# Patient Record
Sex: Female | Born: 1970 | Race: Black or African American | Hispanic: No | Marital: Married | Smoking: Never smoker
Health system: Southern US, Community
[De-identification: ages and names within clinical notes are randomized; demographics above are authoritative.]

## PROBLEM LIST (undated history)

## (undated) DIAGNOSIS — G35 Multiple sclerosis: Secondary | ICD-10-CM

## (undated) DIAGNOSIS — G35D Multiple sclerosis, unspecified: Secondary | ICD-10-CM

## (undated) DIAGNOSIS — I10 Essential (primary) hypertension: Secondary | ICD-10-CM

---

## 2020-10-09 ENCOUNTER — Inpatient Hospital Stay: Payer: Federal, State, Local not specified - PPO

## 2020-10-09 ENCOUNTER — Encounter: Payer: Self-pay | Admitting: Emergency Medicine

## 2020-10-09 ENCOUNTER — Other Ambulatory Visit: Payer: Self-pay

## 2020-10-09 ENCOUNTER — Inpatient Hospital Stay
Admission: EM | Admit: 2020-10-09 | Discharge: 2020-10-12 | DRG: 059 | Disposition: A | Discharge status: Home / Self Care | Payer: Federal, State, Local not specified - PPO | Attending: Internal Medicine | Admitting: Internal Medicine

## 2020-10-09 ENCOUNTER — Emergency Department: Payer: Federal, State, Local not specified - PPO

## 2020-10-09 DIAGNOSIS — Z6823 Body mass index (BMI) 23.0-23.9, adult: Secondary | ICD-10-CM

## 2020-10-09 DIAGNOSIS — Z20822 Contact with and (suspected) exposure to covid-19: Secondary | ICD-10-CM | POA: Diagnosis present

## 2020-10-09 DIAGNOSIS — H547 Unspecified visual loss: Secondary | ICD-10-CM

## 2020-10-09 DIAGNOSIS — N390 Urinary tract infection, site not specified: Secondary | ICD-10-CM | POA: Diagnosis present

## 2020-10-09 DIAGNOSIS — H469 Unspecified optic neuritis: Secondary | ICD-10-CM | POA: Diagnosis present

## 2020-10-09 DIAGNOSIS — G35 Multiple sclerosis: Principal | ICD-10-CM | POA: Diagnosis present

## 2020-10-09 DIAGNOSIS — Z8249 Family history of ischemic heart disease and other diseases of the circulatory system: Secondary | ICD-10-CM

## 2020-10-09 DIAGNOSIS — R001 Bradycardia, unspecified: Secondary | ICD-10-CM | POA: Diagnosis present

## 2020-10-09 DIAGNOSIS — I10 Essential (primary) hypertension: Secondary | ICD-10-CM | POA: Diagnosis present

## 2020-10-09 DIAGNOSIS — E440 Moderate protein-calorie malnutrition: Secondary | ICD-10-CM | POA: Diagnosis present

## 2020-10-09 HISTORY — DX: Multiple sclerosis: G35

## 2020-10-09 HISTORY — DX: Multiple sclerosis, unspecified: G35.D

## 2020-10-09 HISTORY — DX: Essential (primary) hypertension: I10

## 2020-10-09 LAB — BASIC METABOLIC PANEL
Anion gap: 12 (ref 5–15)
BUN: 21 mg/dL — ABNORMAL HIGH (ref 6–20)
CO2: 21 mmol/L — ABNORMAL LOW (ref 22–32)
Calcium: 9.7 mg/dL (ref 8.9–10.3)
Chloride: 104 mmol/L (ref 98–111)
Creatinine, Ser: 1.14 mg/dL — ABNORMAL HIGH (ref 0.44–1.00)
GFR, Estimated: 58 mL/min — ABNORMAL LOW (ref >60)
Glucose, Bld: 95 mg/dL (ref 70–99)
Potassium: 3.9 mmol/L (ref 3.5–5.1)
Sodium: 137 mmol/L (ref 135–145)

## 2020-10-09 LAB — CBC
HCT: 39.2 % (ref 36.0–46.0)
Hemoglobin: 12.9 g/dL (ref 12.0–15.0)
MCH: 27.5 pg (ref 26.0–34.0)
MCHC: 32.9 g/dL (ref 30.0–36.0)
MCV: 83.6 fL (ref 80.0–100.0)
Platelets: 217 10*3/uL (ref 150–400)
RBC: 4.69 MIL/uL (ref 3.87–5.11)
RDW: 13.5 % (ref 11.5–15.5)
WBC: 5.1 10*3/uL (ref 4.0–10.5)
nRBC: 0.4 % — ABNORMAL HIGH (ref 0.0–0.2)

## 2020-10-09 LAB — RESP PANEL BY RT-PCR (FLU A&B, COVID) ARPGX2
Influenza A by PCR: NEGATIVE
Influenza B by PCR: NEGATIVE
SARS Coronavirus 2 by RT PCR: NEGATIVE

## 2020-10-09 IMAGING — MR MR ORBITS WO/W CM
4 of 6 series · 29 of 48 positions shown · IV contrast (6ml Gadavist)
Comparison: None.

CLINICAL DATA: Multiple sclerosis, left eye blurry vision

EXAM:
MRI HEAD AND ORBITS WITHOUT AND WITH CONTRAST
TECHNIQUE: Multiplanar, multiecho pulse sequences of the brain and surrounding
structures were obtained without and with intravenous contrast.
Multiplanar, multiecho pulse sequences of the orbits and surrounding
structures were obtained including fat saturation techniques, before
and after intravenous contrast administration.
CONTRAST:  6mL GADAVIST GADOBUTROL 1 MMOL/ML IV SOLN

[Series 19: T2 · axial · 3.0mm · 0.78mm/px · z∈[-105,-46]mm · 5 of 17 slices shown (1 of 2)]
[im 1/17]
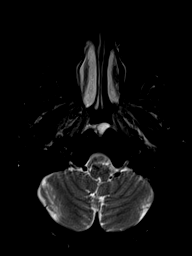
[im 5/17]
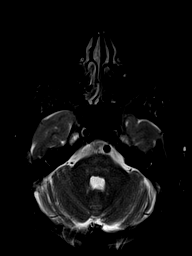
[im 9/17]
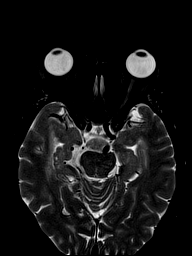
[im 13/17]
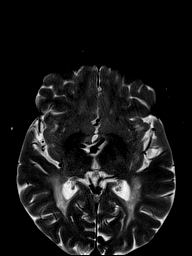
[im 17/17]
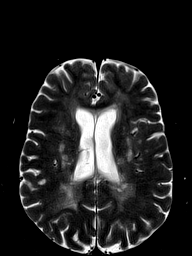

[Series 21: T2 · coronal · 3.0mm · 0.78mm/px · 9 of 35 slices shown (2 of 2)]
[im 1/35]
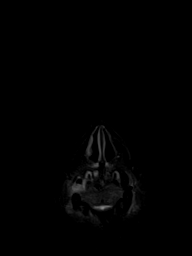
[im 5/35]
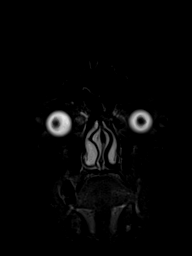
[im 9/35]
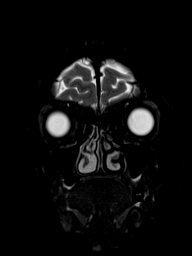
[im 13/35]
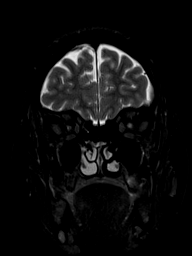
[im 18/35]
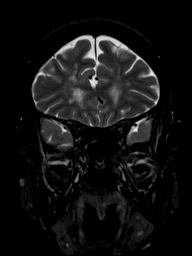
[im 22/35]
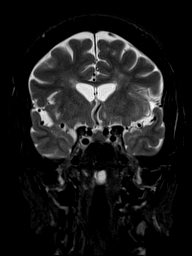
[im 26/35]
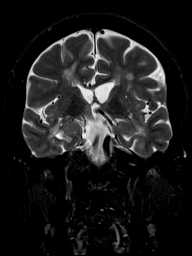
[im 30/35]
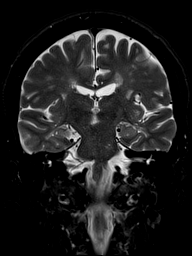
[im 35/35]
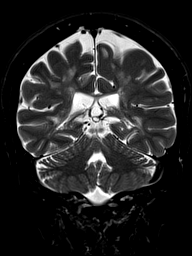

[Series 23: T1 · axial · non-contrast · 3.0mm · 0.31mm/px · z∈[-99,-38]mm · 6 of 23 slices shown (1 of 2)]
[im 1/23]
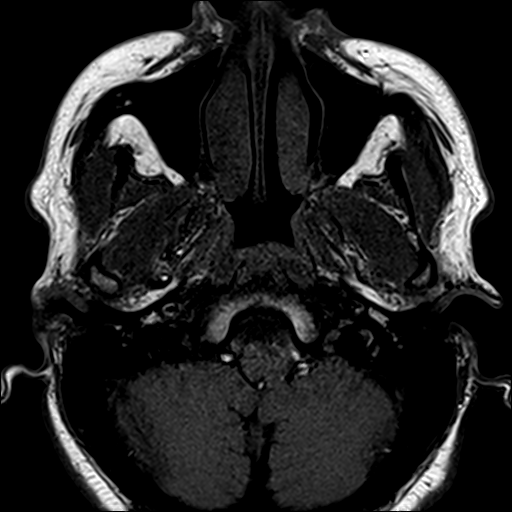
[im 5/23]
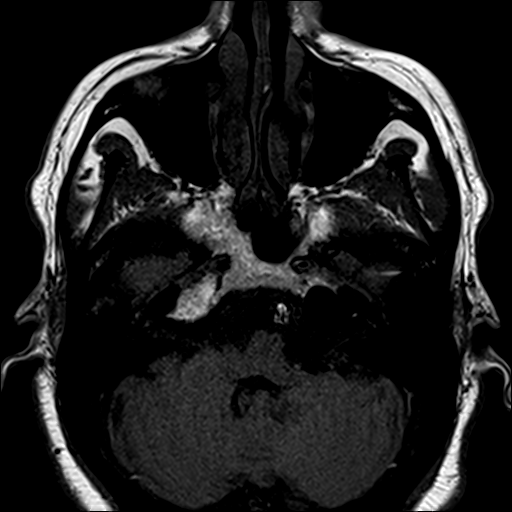
[im 9/23]
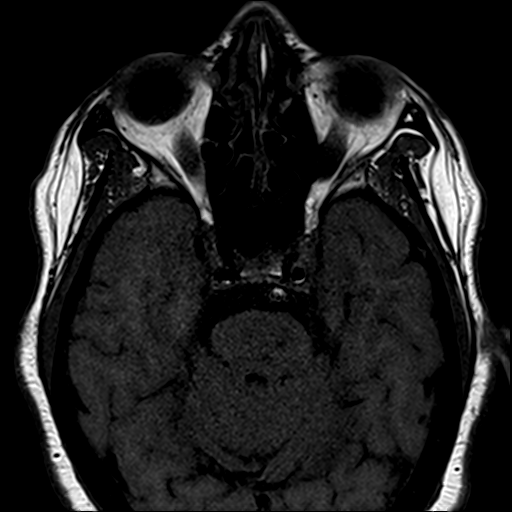
[im 14/23]
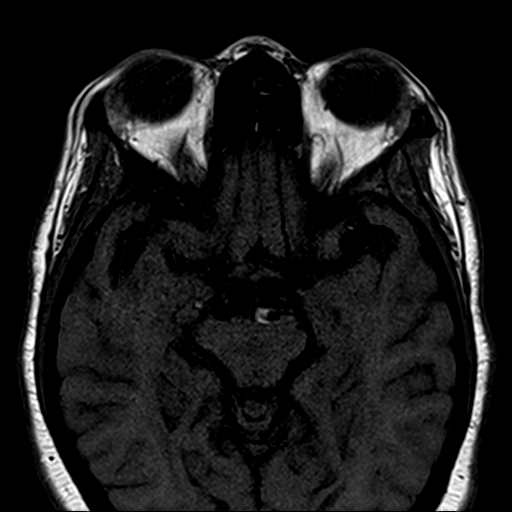
[im 18/23]
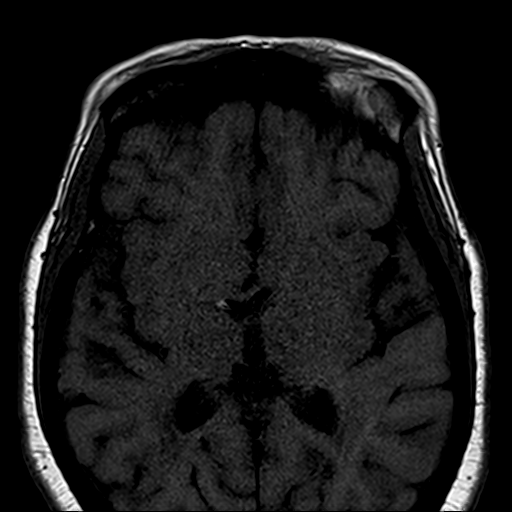
[im 23/23]
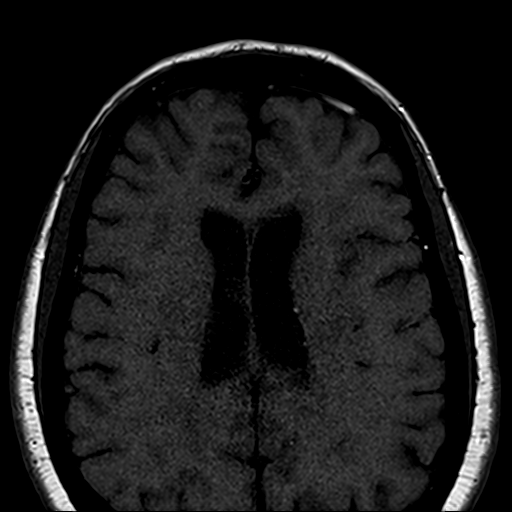

[Series 24: T1 · coronal · non-contrast · 3.0mm · 0.35mm/px · 9 of 40 slices shown (2 of 2)]
[im 1/40]
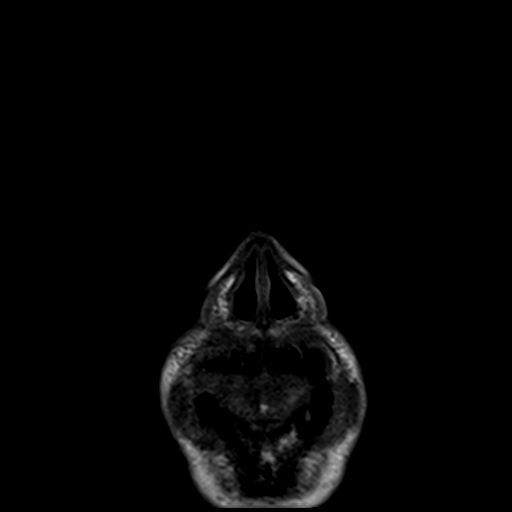
[im 4/40]
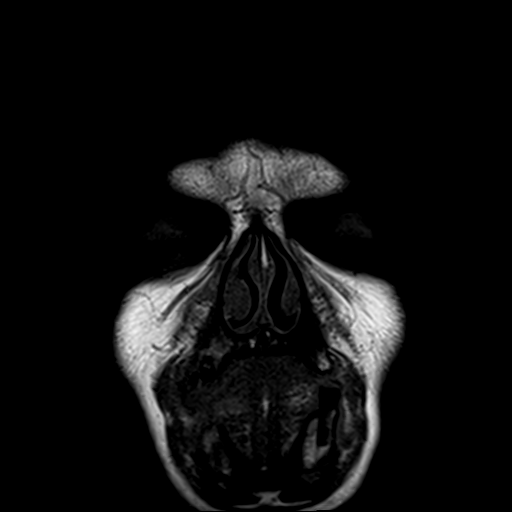
[im 8/40]
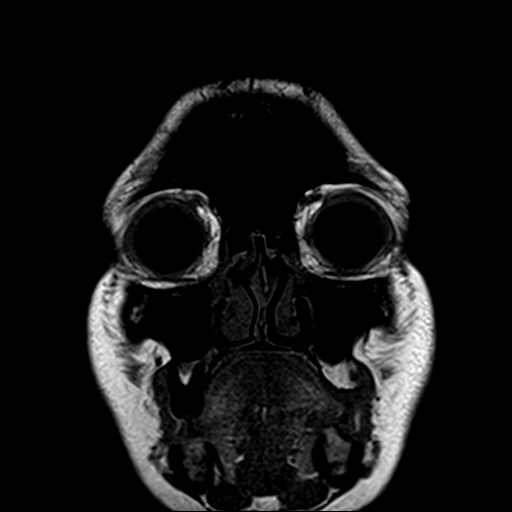
[im 12/40]
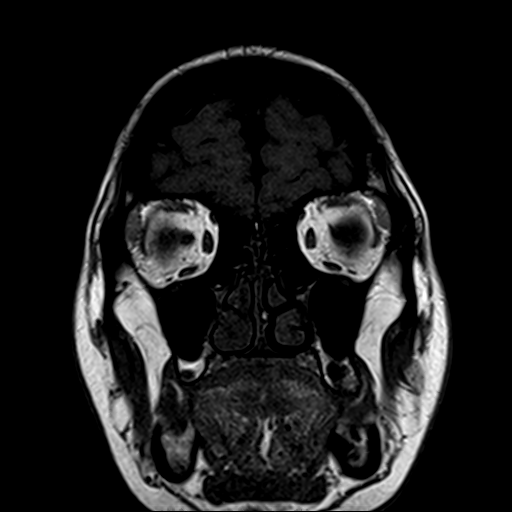
[im 16/40]
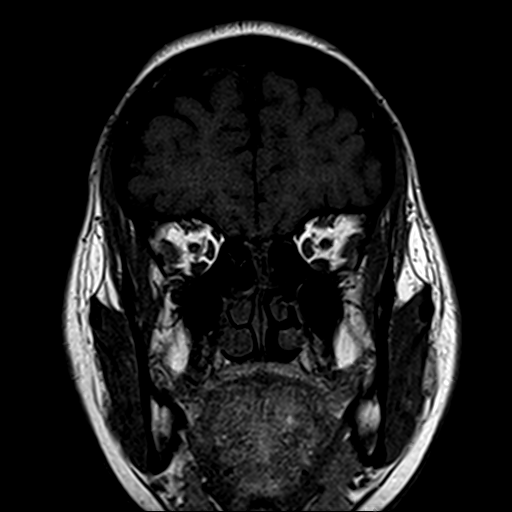
[im 20/40]
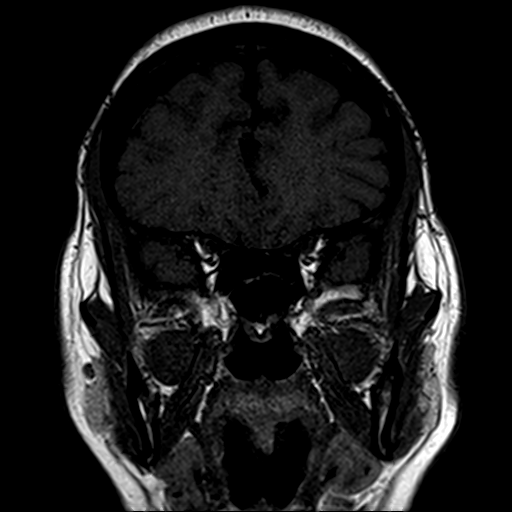
[im 24/40]
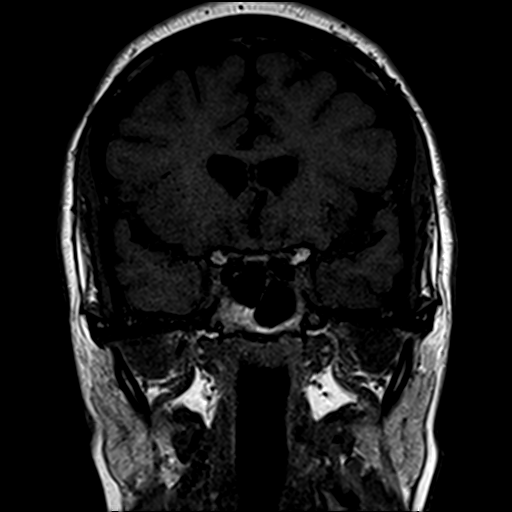
[im 28/40]
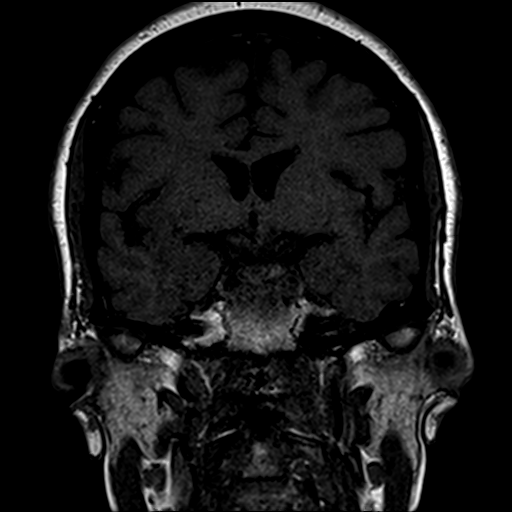
[im 36/40]
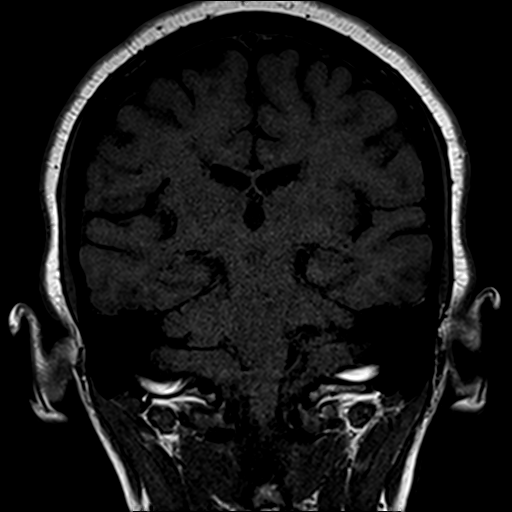

[29 of 48 positions shown; findings below may reference images not displayed]

FINDINGS: MRI HEAD FINDINGS

Brain: There is no acute infarction or intracranial hemorrhage.
Patchy and confluent areas of T2 hyperintensity in the
periventricular greater than subcortical white matter are compatible
with history of multiple sclerosis. There is some involvement of the
brainstem. Ex vacuo dilatation of the lateral ventricles. There is
thinning of the corpus callosum. No abnormal enhancement. No
definite evidence of PML. No prior imaging available for comparison.

Vascular: Major vessel flow voids at the skull base are preserved.

Skull and upper cervical spine: Marrow signal is within normal
limits.

Other: Sella is unremarkable.  Mastoid air cells are clear.

MRI ORBITS FINDINGS

Orbits: There is no proptosis. No intraorbital mass. Globes,
extraocular muscles, and lacrimal glands are symmetric and
unremarkable. There is no abnormal enhancement of the optic nerve
sheath complexes. Left optic nerve T2 hyperintensity is suspected,
but optic nerves also appear subjectively small.

Visualized sinuses: No significant opacification.

Soft tissues: Unremarkable.
IMPRESSION: No optic nerve enhancement. Left optic nerve T2 hyperintensity
suspected within limitation of motion artifact. The optic nerves do
appear subjectively small and therefore, this could reflect edema or
sequelae of prior inflammation (i.e. prior episodes of optic
neuritis).

Moderate white matter disease compatible with history of multiple
sclerosis. No evidence of active demyelination.

## 2020-10-09 IMAGING — CT CT HEAD W/O CM
3 series · 15 of 46 positions shown, 18 images · non-contrast
Comparison: None.

CLINICAL DATA: Neuro deficit. Stroke suspected. Abnormal vision in
the left eye for 2 weeks.

EXAM:
CT HEAD WITHOUT CONTRAST
TECHNIQUE: Contiguous axial images were obtained from the base of the skull
through the vertex without intravenous contrast.

[Series 2: head wo · axial · 0.41mm/px · z∈[+532,+652]mm · 9 of 29 slices shown, 12 images]
[im 3/29  brain]
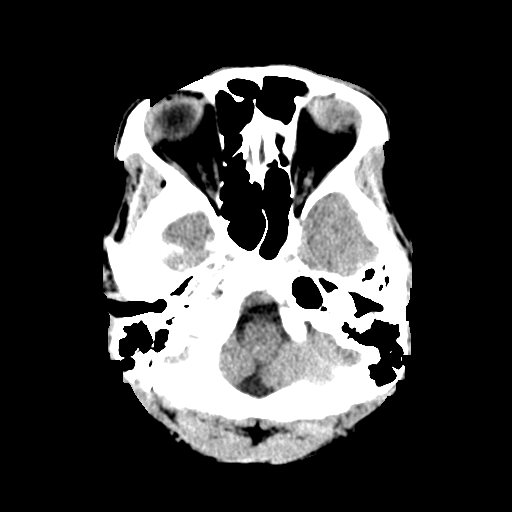
[im 3/29  bone]
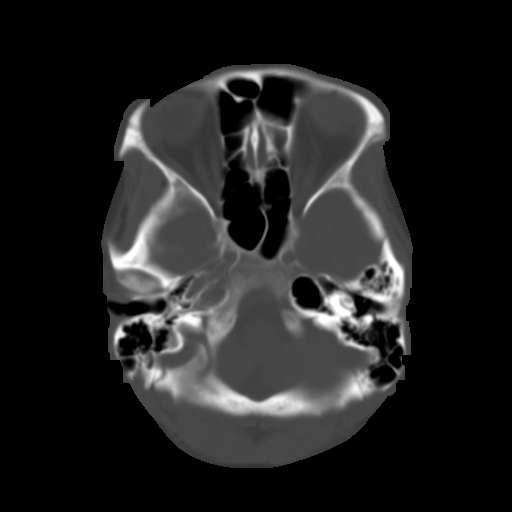
[im 6/29  brain]
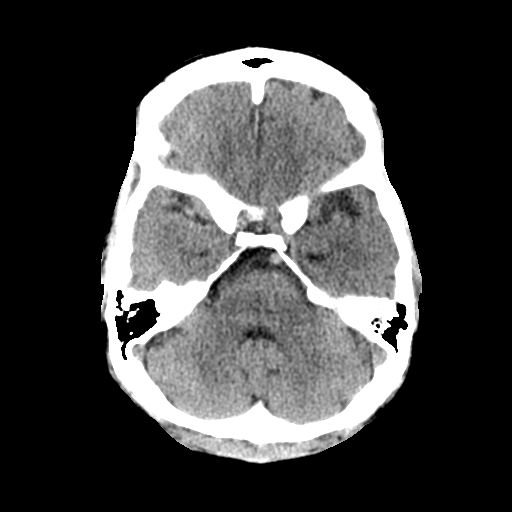
[im 9/29  brain]
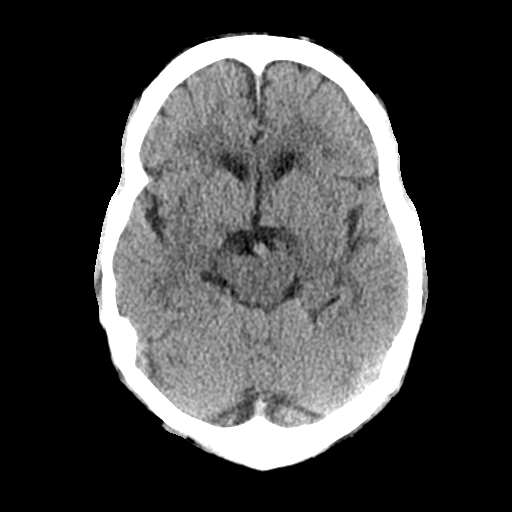
[im 12/29  brain]
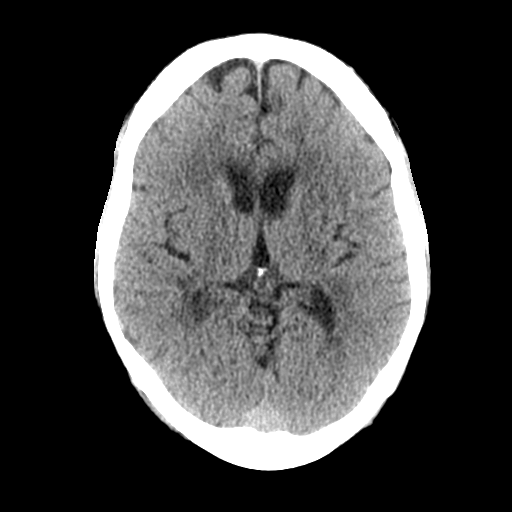
[im 15/29  brain]
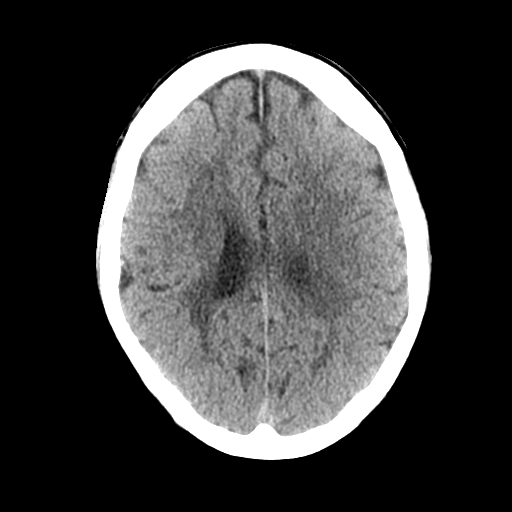
[im 15/29  bone]
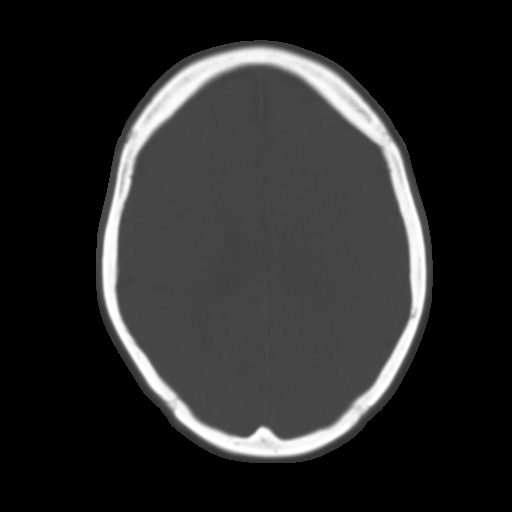
[im 18/29  brain]
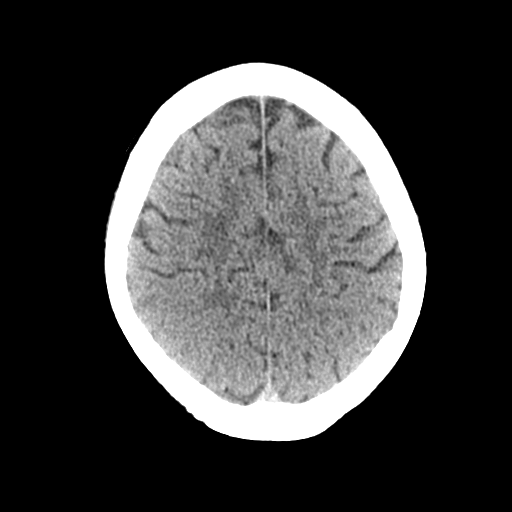
[im 21/29  brain]
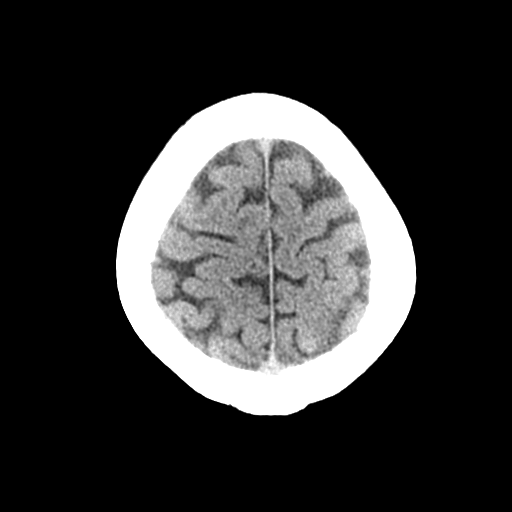
[im 24/29  brain]
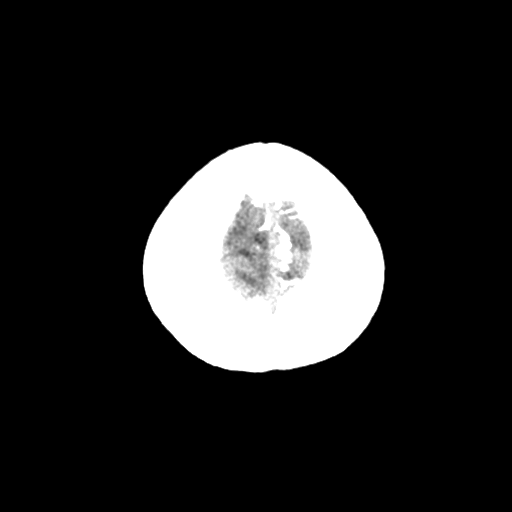
[im 27/29  brain]
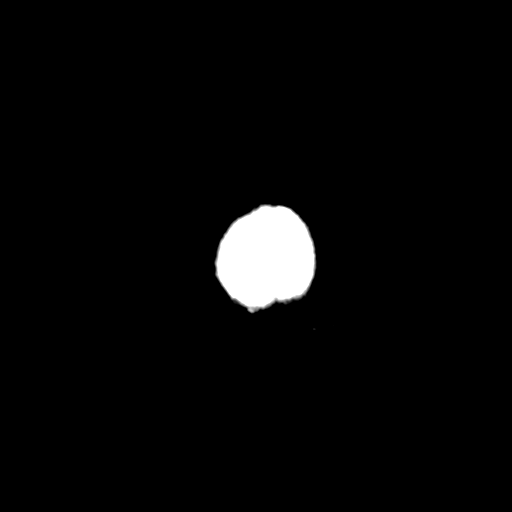
[im 27/29  bone]
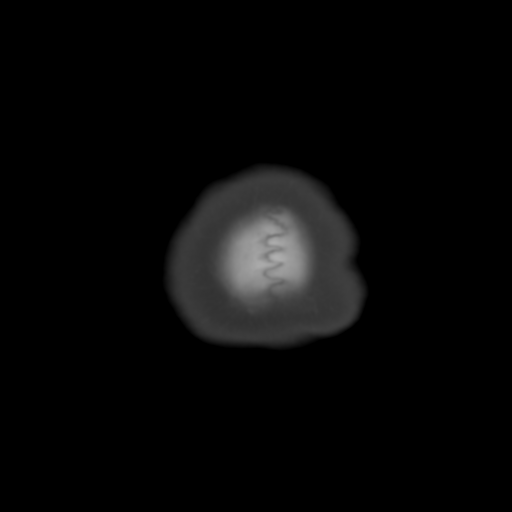

[Series 4: coronal soft tissue · coronal · 0.31mm/px · 3 of 63 slices shown]
[im 21/63  brain]
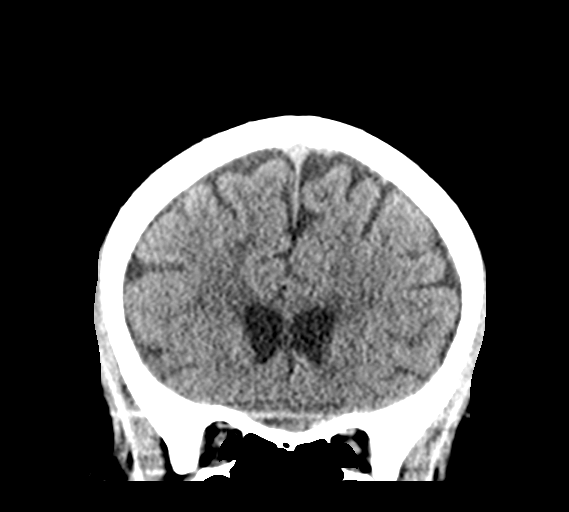
[im 28/63  brain]
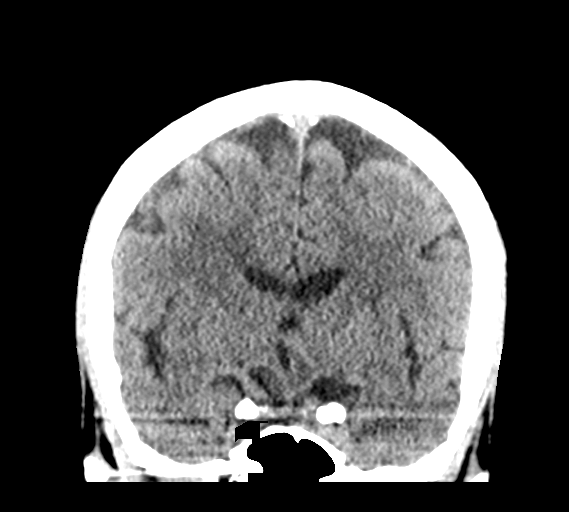
[im 35/63  brain]
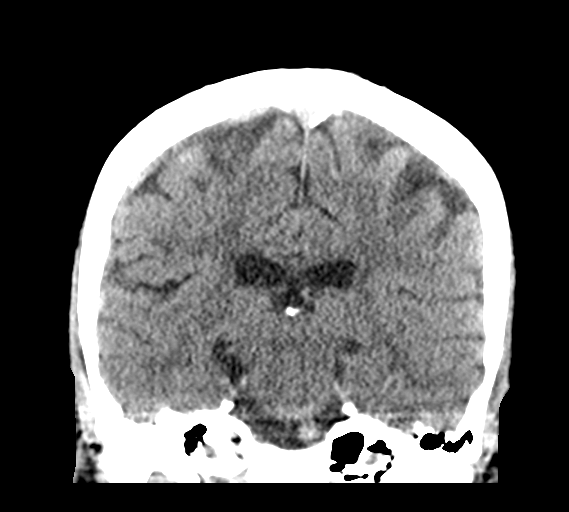

[Series 5: sagittal soft tissue · sagittal · 0.33mm/px · 3 of 52 slices shown]
[im 18/52  brain]
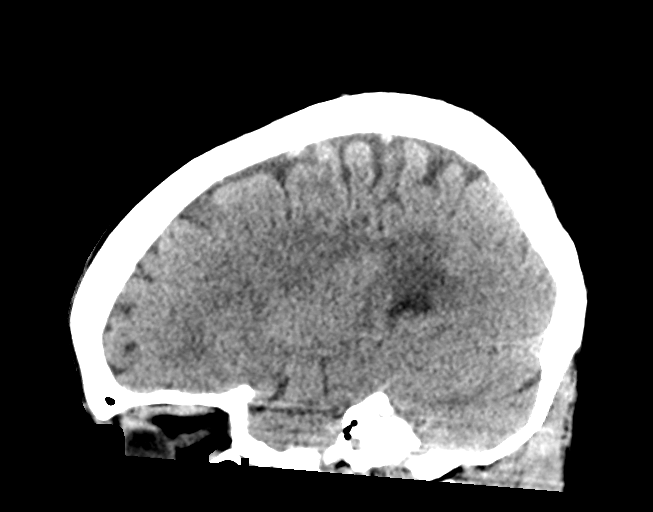
[im 26/52  brain]
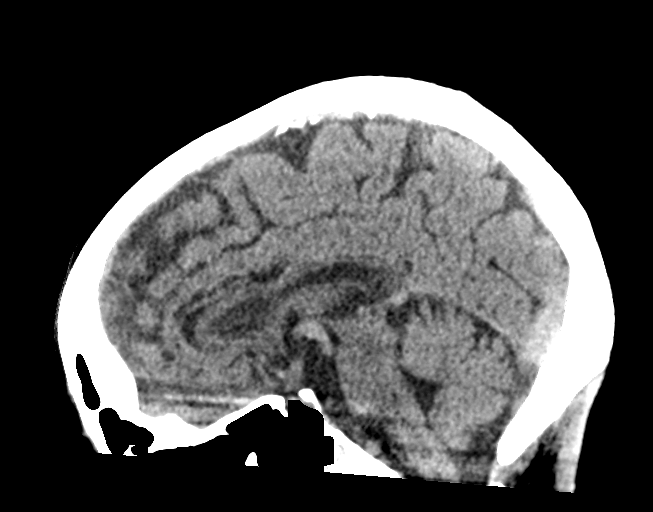
[im 35/52  brain]
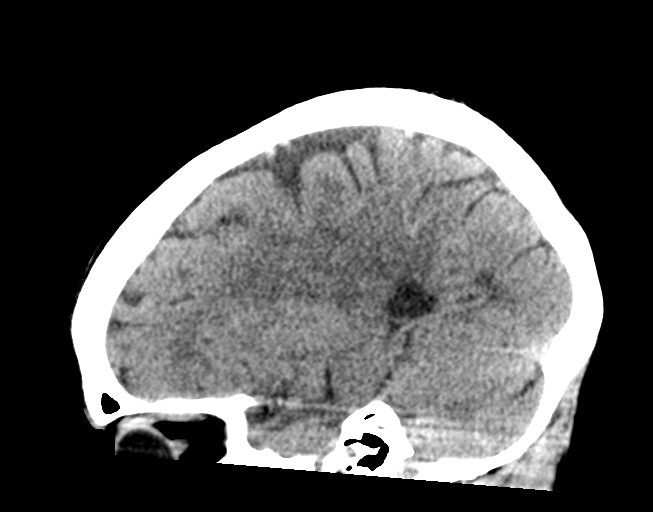

[15 of 46 positions shown; findings below may reference images not displayed]

FINDINGS: Brain: No acute infarct, hemorrhage, or mass lesion is present.
Periventricular and subcortical white matter hypoattenuation is
slightly more prominent than expected for age. The ventricles are of
normal size. No significant extraaxial fluid collection is present.
The brainstem and cerebellum are within normal limits.

Vascular: No hyperdense vessel or unexpected calcification.

Skull: Calvarium is intact. No focal lytic or blastic lesions are
present. No significant extracranial soft tissue lesion is present.

Sinuses/Orbits: The paranasal sinuses and mastoid air cells are
clear. The globes and orbits are within normal limits.
IMPRESSION: 1. Periventricular and subcortical white matter hypoattenuation is
slightly more prominent than expected for age. This likely reflects
the sequela of chronic microvascular ischemia.
2. No acute intracranial abnormality.

## 2020-10-09 IMAGING — MR MR HEAD WO/W CM
15 series · 48 of 48 positions shown · IV contrast (6ml Gadavist)
Comparison: None.

CLINICAL DATA: Multiple sclerosis, left eye blurry vision

EXAM:
MRI HEAD AND ORBITS WITHOUT AND WITH CONTRAST
TECHNIQUE: Multiplanar, multiecho pulse sequences of the brain and surrounding
structures were obtained without and with intravenous contrast.
Multiplanar, multiecho pulse sequences of the orbits and surrounding
structures were obtained including fat saturation techniques, before
and after intravenous contrast administration.
CONTRAST:  6mL GADAVIST GADOBUTROL 1 MMOL/ML IV SOLN

[Series 5: ax dwi_tracew · axial · 3.0mm · 0.65mm/px · z∈[-123,+24]mm · 4 of 48 slices shown]
[im 1/48]
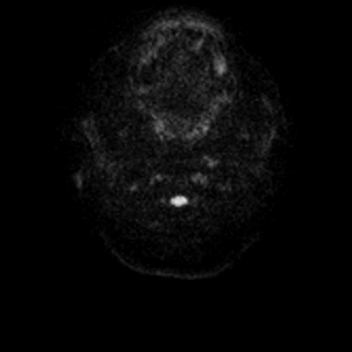
[im 16/48]
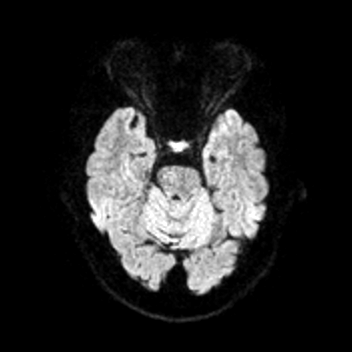
[im 32/48]
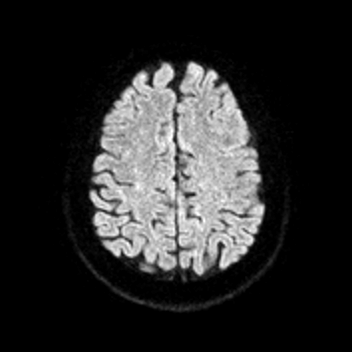
[im 48/48]
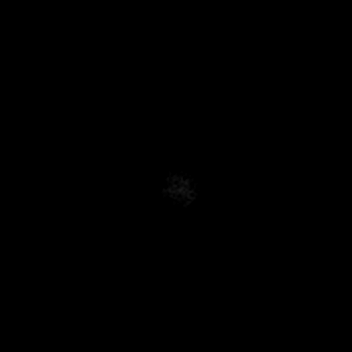

[Series 6: ax dwi_adc · axial · 3.0mm · 0.65mm/px · z∈[-123,+21]mm · 3 of 47 slices shown]
[im 1/47]
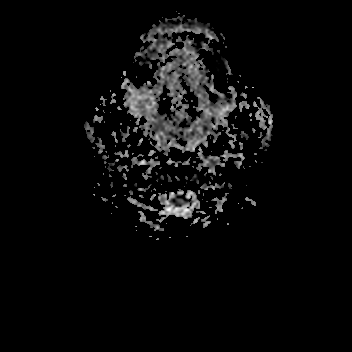
[im 24/47]
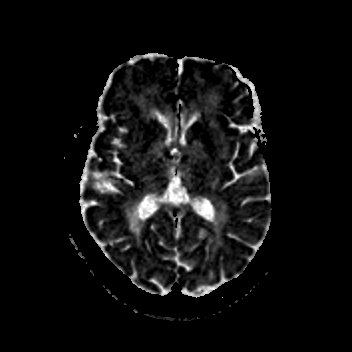
[im 47/47]
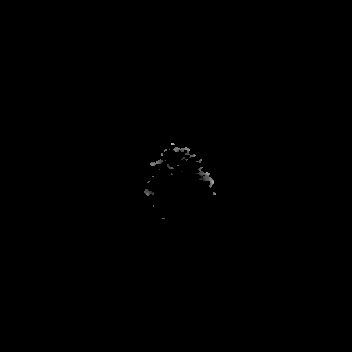

[Series 7: cor dwi_tracew · coronal · 5.0mm · 0.65mm/px · 2 of 40 slices shown]
[im 1/40]
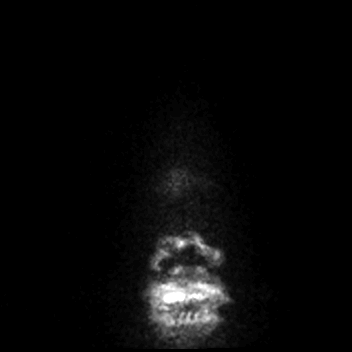
[im 40/40]
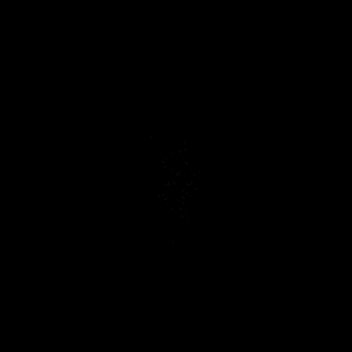

[Series 8: cor dwi_adc · coronal · 5.0mm · 0.65mm/px · 2 of 37 slices shown]
[im 1/37]
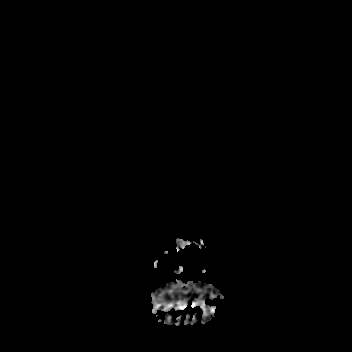
[im 37/37]
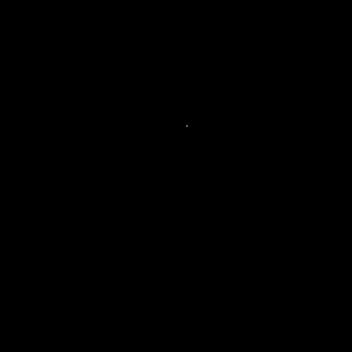

[Series 9: T1 · sagittal · 5.0mm · 0.62mm/px · 1 of 24 slices shown (1 of 2)]
[im 1/24]
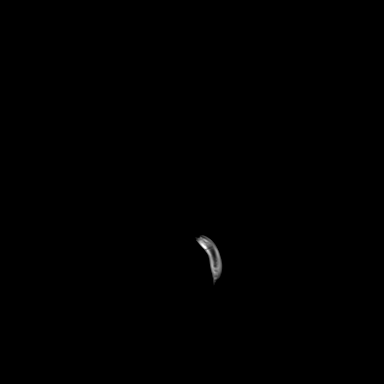

[Series 10: FLAIR · sagittal · 5.0mm · 0.94mm/px · 1 of 25 slices shown (1 of 2)]
[im 1/25]
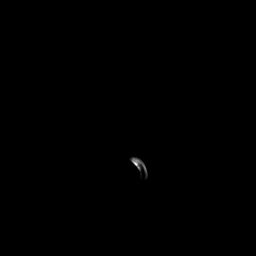

[Series 11: T2 · axial · 5.0mm · 0.53mm/px · 1 of 25 slices shown]
[im 1/25]
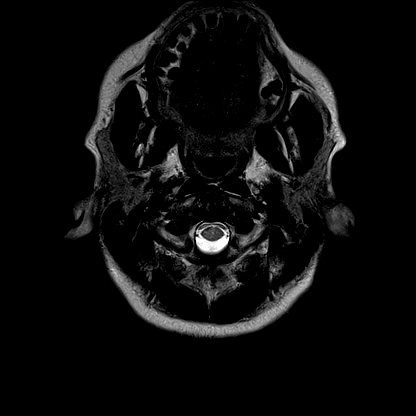

[Series 13: pha_images · axial · 3.0mm · 0.90mm/px · z∈[-133,+35]mm · 3 of 59 slices shown]
[im 1/59]
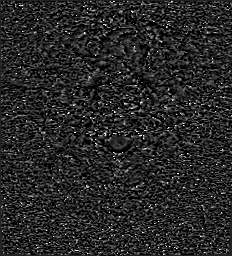
[im 30/59]
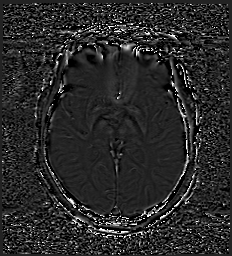
[im 59/59]
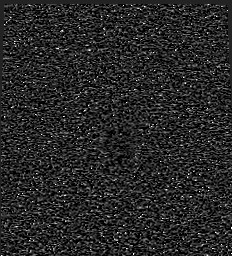

[Series 14: swi_images · axial · 3.0mm · 0.90mm/px · z∈[-133,+35]mm · 3 of 60 slices shown]
[im 1/60]
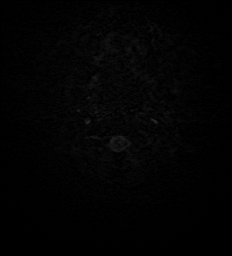
[im 30/60]
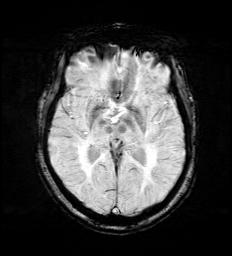
[im 60/60]
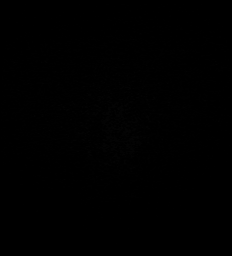

[Series 16: FLAIR · axial · 3.0mm · 0.53mm/px · z∈[-124,+29]mm · 3 of 55 slices shown (2 of 2)]
[im 1/55]
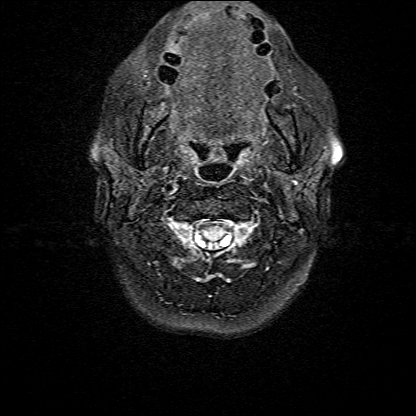
[im 28/55]
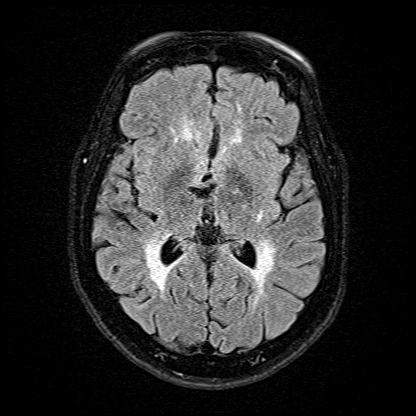
[im 55/55]
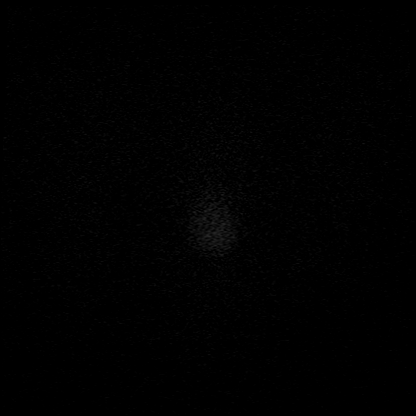

[Series 17: T1 · axial · 1.0mm · 0.98mm/px · z∈[-135,+30]mm · 10 of 176 slices shown (2 of 2)]
[im 1/176]
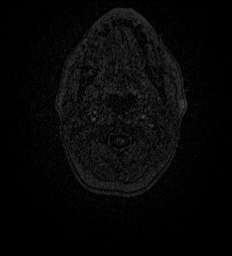
[im 20/176]
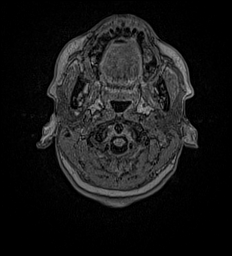
[im 39/176]
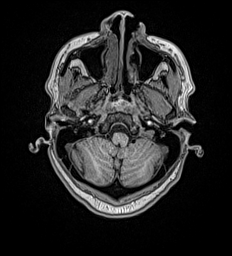
[im 59/176]
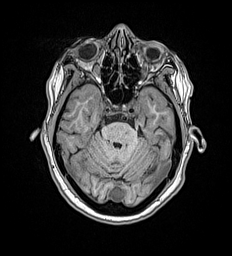
[im 78/176]
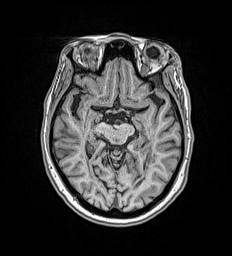
[im 98/176]
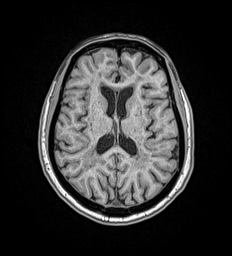
[im 117/176]
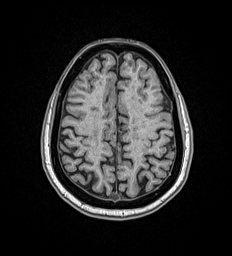
[im 137/176]
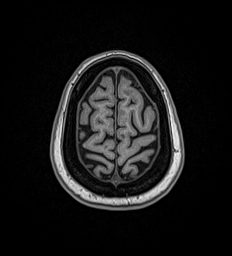
[im 156/176]
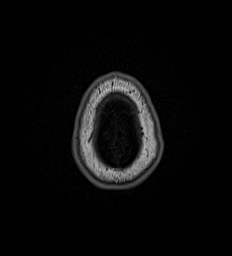
[im 176/176]
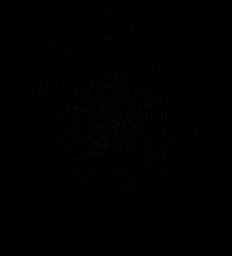

[Series 25: T2 post-contrast · coronal · 5.0mm · 0.57mm/px · 2 of 29 slices shown]
[im 1/29]
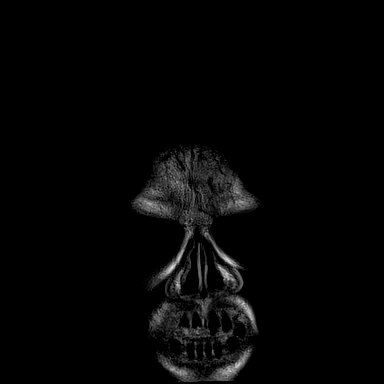
[im 29/29]
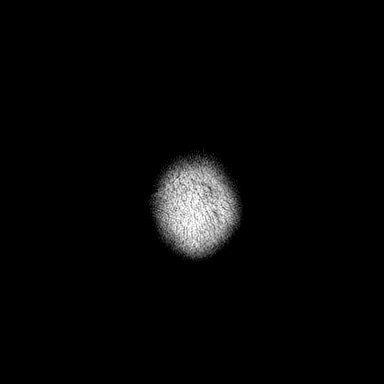

[Series 26: T1 post-contrast · axial · 1.0mm · 0.98mm/px · z∈[-135,+30]mm · 10 of 176 slices shown (1 of 3)]
[im 1/176]
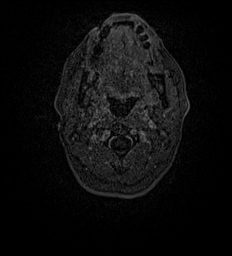
[im 20/176]
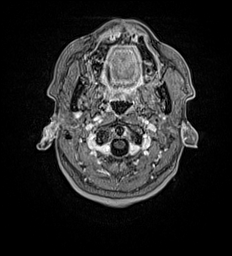
[im 39/176]
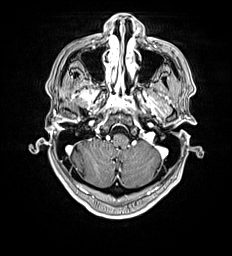
[im 59/176]
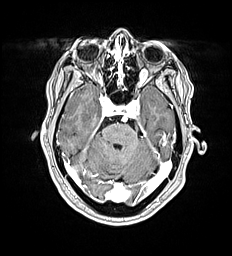
[im 78/176]
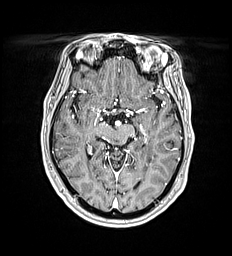
[im 98/176]
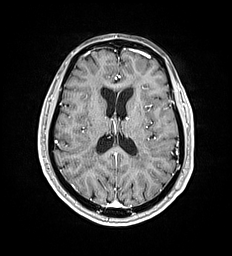
[im 117/176]
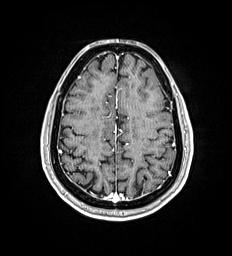
[im 137/176]
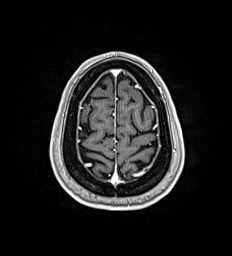
[im 156/176]
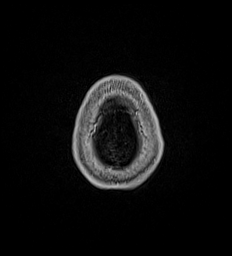
[im 176/176]
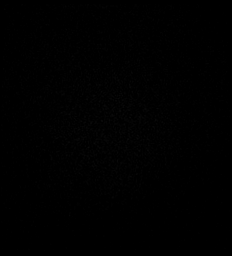

[Series 27: T1 post-contrast · coronal · 5.0mm · 0.57mm/px · 2 of 29 slices shown (2 of 3)]
[im 1/29]
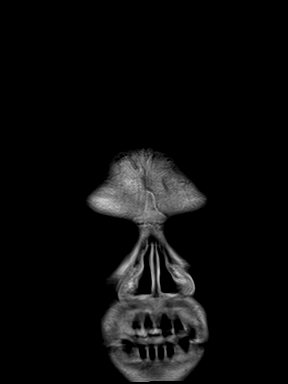
[im 29/29]
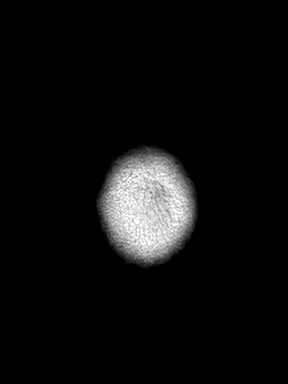

[Series 28: T1 post-contrast · sagittal · 5.0mm · 0.62mm/px · 1 of 25 slices shown (3 of 3)]
[im 1/25]
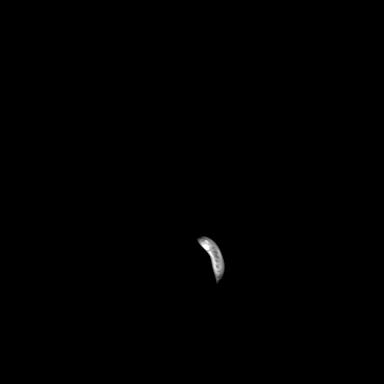

[48 of 48 positions shown; findings below may reference images not displayed]

FINDINGS: MRI HEAD FINDINGS

Brain: There is no acute infarction or intracranial hemorrhage.
Patchy and confluent areas of T2 hyperintensity in the
periventricular greater than subcortical white matter are compatible
with history of multiple sclerosis. There is some involvement of the
brainstem. Ex vacuo dilatation of the lateral ventricles. There is
thinning of the corpus callosum. No abnormal enhancement. No
definite evidence of PML. No prior imaging available for comparison.

Vascular: Major vessel flow voids at the skull base are preserved.

Skull and upper cervical spine: Marrow signal is within normal
limits.

Other: Sella is unremarkable.  Mastoid air cells are clear.

MRI ORBITS FINDINGS

Orbits: There is no proptosis. No intraorbital mass. Globes,
extraocular muscles, and lacrimal glands are symmetric and
unremarkable. There is no abnormal enhancement of the optic nerve
sheath complexes. Left optic nerve T2 hyperintensity is suspected,
but optic nerves also appear subjectively small.

Visualized sinuses: No significant opacification.

Soft tissues: Unremarkable.
IMPRESSION: No optic nerve enhancement. Left optic nerve T2 hyperintensity
suspected within limitation of motion artifact. The optic nerves do
appear subjectively small and therefore, this could reflect edema or
sequelae of prior inflammation (i.e. prior episodes of optic
neuritis).

Moderate white matter disease compatible with history of multiple
sclerosis. No evidence of active demyelination.

## 2020-10-09 MED ORDER — SODIUM CHLORIDE 0.9 % IV SOLN
1000.0000 mg | Freq: Once | INTRAVENOUS | Status: AC
  Administered 2020-10-09: 1000 mg via INTRAVENOUS
  Filled 2020-10-09: qty 8

## 2020-10-09 MED ORDER — NIFEDIPINE ER OSMOTIC RELEASE 30 MG PO TB24
30.0000 mg | ORAL_TABLET | Freq: Every day | ORAL | Status: DC
  Administered 2020-10-10 – 2020-10-12 (×3): 30 mg via ORAL
  Filled 2020-10-09 (×3): qty 1

## 2020-10-09 MED ORDER — ACETAMINOPHEN 650 MG RE SUPP: 650.0000 mg | RECTAL | Status: DC | PRN

## 2020-10-09 MED ORDER — STROKE: EARLY STAGES OF RECOVERY BOOK: Freq: Once | Status: DC

## 2020-10-09 MED ORDER — ASPIRIN 300 MG RE SUPP: 300.0000 mg | Freq: Every day | RECTAL | Status: DC

## 2020-10-09 MED ORDER — ACETAMINOPHEN 160 MG/5ML PO SOLN
650.0000 mg | ORAL | Status: DC | PRN
  Filled 2020-10-09: qty 20.3

## 2020-10-09 MED ORDER — ASPIRIN 325 MG PO TABS
325.0000 mg | ORAL_TABLET | Freq: Every day | ORAL | Status: DC
  Administered 2020-10-09 – 2020-10-12 (×4): 325 mg via ORAL
  Filled 2020-10-09 (×4): qty 1

## 2020-10-09 MED ORDER — ACETAMINOPHEN 325 MG PO TABS: 650.0000 mg | ORAL_TABLET | ORAL | Status: DC | PRN

## 2020-10-09 MED ORDER — GADOBUTROL 1 MMOL/ML IV SOLN
6.0000 mL | Freq: Once | INTRAVENOUS | Status: AC | PRN
  Administered 2020-10-09: 6 mL via INTRAVENOUS

## 2020-10-09 NOTE — ED Notes (Signed)
Pt speaking with MRI at this time for screening.  

## 2020-10-09 NOTE — H&P (Signed)
History and Physical    Lynn Alvarez JME:[id number] DOB: 03/13/1971 DOA: 10/09/2020  PCP: Lyn Records, MD   Patient coming from: Home  I have personally briefly reviewed patient's old medical records in [facility]  Chief Complaint: Blurred vision  HPI: Lynn Alvarez is a 50 y.o. female with medical history significant for multiple sclerosis and hypertension who presents to the emergency room for evaluation of visual impairment involving her left eye. Patient states that she developed sudden onset visual impairment in her left eye which she describes as a big black spot that she could not see past but was able to see around her peripheral vision.  This happened 2 weeks ago and over time has improved but now her vision in her left eye is blurred.  She had headache in the left occipital area 2 weeks ago when this happened but that has resolved. She denies having any pain in her left thigh and denies having any trauma. She has MS and gets monthly infusion with Tysabri. She denies having any chest pain, no shortness of breath, no dizziness, no lightheadedness, no focal deficits, no difficulty swallowing, no weakness, no slurred speech no nausea, no vomiting, no diaphoresis or palpitations. Labs show sodium 137, potassium 3.9, chloride 104, bicarb 21, glucose 95, BUN 21, creatinine 1.4, calcium 9.7, white count 5.1, hemoglobin 12.9, hematocrit 39.2, MCV 83.6, RDW 13.5, platelet count 217, Respiratory viral panel is negative CT scan of the head without contrast shows periventricular and subcortical white matter hypoattenuation is slightly more prominent than expected for age.  This likely reflects the sequela of chronic microvascular ischemia.  No acute intracranial abnormality. MRI results are pending Twelve-lead EKG reviewed by me shows normal sinus rhythm with nonspecific T wave abnormality.     ED Course: Patient is a 50 year old African-American female with a history of  multiple sclerosis and presents to the ER for evaluation of visual impairment involving her left eye which started 2 weeks prior to her presentation. She describes it as having a big black spot in the center of her vision which she could not see past without improvement in her peripheral vision.  This was associated with a headache which has resolved. Patient now complains of persistent blurred vision in her left eye. Neurology was consulted in the ER and they recommended to give patient 1 g of Solu-Medrol IV and to obtain an MRI of the brain with and without contrast as well as MRI of the orbits with and without contrast.  Review of Systems: As per HPI otherwise all other systems reviewed and negative.    Past Medical History:  Diagnosis Date  . Hypertension   . Multiple sclerosis (HCC)     History reviewed. No pertinent surgical history.   reports that she has never smoked. She has never used smokeless tobacco. She reports previous alcohol use. She reports previous drug use.  Allergies  Allergen Reactions  . Lisinopril Cough    Family History  Problem Relation Age of Onset  . Hypertension Mother       Prior to Admission medications   Not on File    Physical Exam: Vitals:   10/09/20 1620  BP: (!) 163/98  Pulse: 60  Resp: 16  Temp: 98.2 F (36.8 C)  TempSrc: Oral  SpO2: 100%  Weight: 62.1 kg  Height: 5\' 4"  (1.626 m)     Vitals:   10/09/20 1620  BP: (!) 163/98  Pulse: 60  Resp: 16  Temp: 98.2 F (36.8  C)  TempSrc: Oral  SpO2: 100%  Weight: 62.1 kg  Height: 5\' 4"  (1.626 m)      Constitutional: Alert and oriented x 3. Not in any apparent distress HEENT:      Head: Normocephalic and atraumatic.         Eyes: PERLA, EOMI, Conjunctivae are normal. Sclera is non-icteric.       Mouth/Throat: Mucous membranes are moist.       Neck: Supple with no signs of meningismus. Cardiovascular: Regular rate and rhythm. No murmurs, gallops, or rubs. 2+ symmetrical  distal pulses are present . No JVD. No LE edema Respiratory: Respiratory effort normal .Lungs sounds clear bilaterally. No wheezes, crackles, or rhonchi.  Gastrointestinal: Soft, non tender, and non distended with positive bowel sounds.  Genitourinary: No CVA tenderness. Musculoskeletal: Nontender with normal range of motion in all extremities. No cyanosis, or erythema of extremities. Neurologic:  Face is symmetric. Moving all extremities. No gross focal neurologic deficits . Skin: Skin is warm, dry.  No rash or ulcers Psychiatric: Mood and affect are normal   Labs on Admission: I have personally reviewed following labs and imaging studies  CBC: Recent Labs  Lab 10/09/20 1630  WBC 5.1  HGB 12.9  HCT 39.2  MCV 83.6  PLT 217   Basic Metabolic Panel: Recent Labs  Lab 10/09/20 1630  NA 137  K 3.9  CL 104  CO2 21*  GLUCOSE 95  BUN 21*  CREATININE 1.14*  CALCIUM 9.7   GFR: Estimated Creatinine Clearance: 50.4 mL/min (A) (by C-G formula based on SCr of 1.14 mg/dL (H)). Liver Function Tests: No results for input(s): AST, ALT, ALKPHOS, BILITOT, PROT, ALBUMIN in the last 168 hours. No results for input(s): LIPASE, AMYLASE in the last 168 hours. No results for input(s): AMMONIA in the last 168 hours. Coagulation Profile: No results for input(s): INR, PROTIME in the last 168 hours. Cardiac Enzymes: No results for input(s): CKTOTAL, CKMB, CKMBINDEX, TROPONINI in the last 168 hours. BNP (last 3 results) No results for input(s): PROBNP in the last 8760 hours. HbA1C: No results for input(s): HGBA1C in the last 72 hours. CBG: No results for input(s): GLUCAP in the last 168 hours. Lipid Profile: No results for input(s): CHOL, HDL, LDLCALC, TRIG, CHOLHDL, LDLDIRECT in the last 72 hours. Thyroid Function Tests: No results for input(s): TSH, T4TOTAL, FREET4, T3FREE, THYROIDAB in the last 72 hours. Anemia Panel: No results for input(s): VITAMINB12, FOLATE, FERRITIN, TIBC, IRON,  RETICCTPCT in the last 72 hours. Urine analysis: No results found for: COLORURINE, APPEARANCEUR, LABSPEC, PHURINE, GLUCOSEU, HGBUR, BILIRUBINUR, KETONESUR, PROTEINUR, UROBILINOGEN, NITRITE, LEUKOCYTESUR  Radiological Exams on Admission: CT HEAD WO CONTRAST  Result Date: 10/09/2020 CLINICAL DATA:  Neuro deficit. Stroke suspected. Abnormal vision in the left eye for 2 weeks. EXAM: CT HEAD WITHOUT CONTRAST TECHNIQUE: Contiguous axial images were obtained from the base of the skull through the vertex without intravenous contrast. COMPARISON:  None. FINDINGS: Brain: No acute infarct, hemorrhage, or mass lesion is present. Periventricular and subcortical white matter hypoattenuation is slightly more prominent than expected for age. The ventricles are of normal size. No significant extraaxial fluid collection is present. The brainstem and cerebellum are within normal limits. Vascular: No hyperdense vessel or unexpected calcification. Skull: Calvarium is intact. No focal lytic or blastic lesions are present. No significant extracranial soft tissue lesion is present. Sinuses/Orbits: The paranasal sinuses and mastoid air cells are clear. The globes and orbits are within normal limits. IMPRESSION: 1. Periventricular and subcortical white matter  hypoattenuation is slightly more prominent than expected for age. This likely reflects the sequela of chronic microvascular ischemia. 2. No acute intracranial abnormality. Electronically Signed   By: Marin Roberts M.D.   On: 10/09/2020 16:55     Assessment/Plan Principal Problem:   Visual impairment Active Problems:   Hypertension   Multiple sclerosis (HCC)     Visual impairment involving the left eye Concern for possible optic neuritis in a patient with known multiple sclerosis. Patient received a dose of Solu-Medrol 1 g IV Follow-up results of MRI of the brain and orbits with and without contrast Rule out possible acute stroke Place patient on  aspirin Follow-up results of MRI of the brain to rule out an acute infarct     History of multiple sclerosis Patient is on monthly IV infusions of Tysabri. Follow-up with neurology as an outpatient     Hypertension Continue Procardia XL 30 mg daily    DVT prophylaxis: SCD Code Status: full code Family Communication: Greater than 50% of time was spent discussing patient's condition and plan of care with her at the bedside.  All questions and concerns have been addressed.  She verbalizes understanding and agrees with the plan. Disposition Plan: Back to previous home environment Consults called: Neurology Status: At the time of admission, it appears that the appropriate admission status for this patient is inpatient. This is judged to be reasonable and necessary in order to provide the required intensity of service to ensure the patient's safety given the presenting symptoms, physical exam findings and initial radiographic and laboratory data in the context of their comorbid conditions. Patient requires inpatient status due to high intensity of service, high risk for further deterioration and high frequency of surveillance required.    Lucile Shutters MD [facility]     10/09/2020, 8:06 PM

## 2020-10-09 NOTE — ED Notes (Signed)
Pt in MRI at this time 

## 2020-10-09 NOTE — ED Triage Notes (Signed)
Pt comes into the ED via POV c/o blurred vision in the left eye that started 2 tuesdays ago.  Pt explains that initially it was a big black spot that she couldn't see past, and since then it has dissipated and now its just very blurry.  Pt denies any weakness, slurred speech, ext.  Pt is denies any diabetes.  Pt A&Ox4 and in NAD at this time.

## 2020-10-09 NOTE — ED Notes (Signed)
Pt provided with boxed lunch and ice water, 2 warm blankets. She is speaking with family on the phone at this time. Denies further needs.

## 2020-10-09 NOTE — ED Provider Notes (Signed)
[facility] Emergency Department Provider Note  ____________________________________________   None    (approximate)  I have reviewed the triage vital signs and the nursing notes.   HISTORY  Chief Complaint Blurred Vision    HPI Ilma Achee is a 50 y.o. female presents emergency department stating that she has blurred vision in the left eye.  Patient has a history of hypertension and MS.  States it started as a dark black spot 2 days ago.  States the spot has resolved but now has blurred vision in the left eye.  Patient denies any history of glaucoma.  She had no pain in the left eye.  States she can see fine when she opens the right eye.    Past Medical History:  Diagnosis Date  . Hypertension   . Multiple sclerosis [facility])     Patient Active Problem List   Diagnosis Date Noted  . Visual impairment 10/09/2020    History reviewed. No pertinent surgical history.  Prior to Admission medications   Not on File    Allergies Lisinopril  History reviewed. No pertinent family history.  Social History Social History   Tobacco Use  . Smoking status: Never Smoker  . Smokeless tobacco: Never Used  Substance Use Topics  . Alcohol use: Not Currently  . Drug use: Not Currently    Review of Systems  Constitutional: No fever/chills Eyes: Positive visual changes. ENT: No sore throat. Respiratory: Denies cough Cardiovascular: Denies chest pain Gastrointestinal: Denies abdominal pain Genitourinary: Negative for dysuria. Musculoskeletal: Negative for back pain. Skin: Negative for rash. Psychiatric: no mood changes,     ____________________________________________   PHYSICAL EXAM:  VITAL SIGNS: ED Triage Vitals [10/09/20 1620]  Enc Vitals Group     BP (!) 163/98     Pulse Rate 60     Resp 16     Temp 98.2 F (36.8 C)     Temp Source Oral     SpO2 100 %     Weight 137 lb (62.1 kg)     Height 5\' 4"  (1.626 m)     Head  Circumference      Peak Flow      Pain Score 0     Pain Loc      Pain Edu?      Excl. in GC?     Constitutional: Alert and oriented. Well appearing and in no acute distress. Eyes: Conjunctivae are normal.  PERRL, EOMI, no nystagmus noted Head: Atraumatic. Nose: No congestion/rhinnorhea. Mouth/Throat: Mucous membranes are moist.   Neck:  supple no lymphadenopathy noted Cardiovascular: Normal rate, regular rhythm. Heart sounds are normal Respiratory: Normal respiratory effort.  No retractions, lungs c t a  GU: deferred Musculoskeletal: FROM all extremities, warm and well perfused Neurologic:  Normal speech and language.  Skin:  Skin is warm, dry and intact. No rash noted. Psychiatric: Mood and affect are normal. Speech and behavior are normal.  ____________________________________________   LABS (all labs ordered are listed, but only abnormal results are displayed)  Labs Reviewed  BASIC METABOLIC PANEL - Abnormal; Notable for the following components:      Result Value   CO2 21 (*)    BUN 21 (*)    Creatinine, Ser 1.14 (*)    GFR, Estimated 58 (*)    All other components within normal limits  CBC - Abnormal; Notable for the following components:   nRBC 0.4 (*)    All other components within normal limits  RESP  PANEL BY RT-PCR (FLU A&B, COVID) ARPGX2  URINALYSIS, COMPLETE (UACMP) WITH MICROSCOPIC   ____________________________________________   ____________________________________________  RADIOLOGY  CT of the head MRI of the brain and orbits with and without contrast  ____________________________________________   PROCEDURES  Procedure(s) performed: Visual acuity   Procedures    ____________________________________________   INITIAL IMPRESSION / ASSESSMENT AND PLAN / ED COURSE  Pertinent labs & imaging results that were available during my care of the patient were reviewed by me and considered in my medical decision making (see chart for details).    Patient is 50 year old female presents with visual changes.  See HPI.  Physical exam shows patient were stable  DDx: MS flare, optic neuritis, optic migraine, retinal detachment  CBC is normal, basic metabolic panel has slight elevations but is basically normal.  CT of the head reviewed by me confirmed by radiology shows chronic microvascular disease.  No acute abnormality noted  Visual acuity ordered  MRI of the brain and orbits with and without contrast  Consulted with neurology.  Dr.Arora advises MRI of the brain with and without contrast, MRI of the orbits with and without contrast, Solu-Medrol 1 g IV tonight and he will see on the floor tomorrow.  Hospitalist was given same information.  Patient is being admitted stable condition.     Adely Facer was evaluated in Emergency Department on 10/09/2020 for the symptoms described in the history of present illness. She was evaluated in the context of the global COVID-19 pandemic, which necessitated consideration that the patient might be at risk for infection with the SARS-CoV-2 virus that causes COVID-19. Institutional protocols and algorithms that pertain to the evaluation of patients at risk for COVID-19 are in a state of rapid change based on information released by regulatory bodies including the CDC and federal and state organizations. These policies and algorithms were followed during the patient's care in the ED.    As part of my medical decision making, I reviewed the following data within the electronic MEDICAL RECORD NUMBER Nursing notes reviewed and incorporated, Labs reviewed , Old chart reviewed, Radiograph reviewed , Discussed with admitting physician , A consult was requested and obtained from this/these consultant(s) Neurology, Notes from prior ED visits and [place] Controlled Substance Database  ____________________________________________   FINAL CLINICAL IMPRESSION(S) / ED DIAGNOSES  Final diagnoses:  Optic neuritis due to  multiple sclerosis (HCC)  Multiple sclerosis exacerbation (HCC)      NEW MEDICATIONS STARTED DURING THIS VISIT:  New Prescriptions   No medications on file     Note:  This document was prepared using Dragon voice recognition software and may include unintentional dictation errors.    Faythe Ghee, PA-C 10/09/20 1916    Sharman Cheek, MD 10/09/20 2157

## 2020-10-09 NOTE — ED Notes (Signed)
Pharmacy messaged to send PO aspirin to ED d/t not being loaded in pyxis

## 2020-10-10 ENCOUNTER — Inpatient Hospital Stay
Admit: 2020-10-10 | Discharge: 2020-10-10 | Disposition: A | Discharge status: Home / Self Care | Payer: Federal, State, Local not specified - PPO | Attending: Internal Medicine | Admitting: Internal Medicine

## 2020-10-10 DIAGNOSIS — G35 Multiple sclerosis: Principal | ICD-10-CM

## 2020-10-10 DIAGNOSIS — R001 Bradycardia, unspecified: Secondary | ICD-10-CM

## 2020-10-10 DIAGNOSIS — N390 Urinary tract infection, site not specified: Secondary | ICD-10-CM

## 2020-10-10 LAB — URINALYSIS, COMPLETE (UACMP) WITH MICROSCOPIC
Bilirubin Urine: NEGATIVE
Glucose, UA: NEGATIVE mg/dL
Hgb urine dipstick: NEGATIVE
Ketones, ur: NEGATIVE mg/dL
Nitrite: POSITIVE — AB
Protein, ur: NEGATIVE mg/dL
Specific Gravity, Urine: 1.031 — ABNORMAL HIGH (ref 1.005–1.030)
pH: 5 (ref 5.0–8.0)

## 2020-10-10 LAB — LIPID PANEL
Cholesterol: 209 mg/dL — ABNORMAL HIGH (ref 0–200)
HDL: 40 mg/dL — ABNORMAL LOW (ref >40)
LDL Cholesterol: 163 mg/dL — ABNORMAL HIGH (ref 0–99)
Total CHOL/HDL Ratio: 5.2 RATIO
Triglycerides: 31 mg/dL (ref <150)
VLDL: 6 mg/dL (ref 0–40)

## 2020-10-10 LAB — ECHOCARDIOGRAM COMPLETE
AR max vel: 1.79 cm2
AV Area VTI: 1.66 cm2
AV Area mean vel: 1.83 cm2
AV Mean grad: 8 mmHg
AV Peak grad: 15.8 mmHg
Ao pk vel: 1.99 m/s
Area-P 1/2: 2.53 cm2
Height: 64 in
MV VTI: 1.97 cm2
S' Lateral: 2 cm
Weight: 2192 oz

## 2020-10-10 LAB — HIV ANTIBODY (ROUTINE TESTING W REFLEX): HIV Screen 4th Generation wRfx: NONREACTIVE

## 2020-10-10 MED ORDER — TRIAMTERENE-HCTZ 37.5-25 MG PO TABS
1.0000 | ORAL_TABLET | Freq: Every day | ORAL | Status: DC
  Administered 2020-10-10 – 2020-10-12 (×3): 1 via ORAL
  Filled 2020-10-10 (×3): qty 1

## 2020-10-10 MED ORDER — SODIUM CHLORIDE 0.9 % IV SOLN
1000.0000 mg | Freq: Every day | INTRAVENOUS | Status: AC
  Administered 2020-10-11: 11:00:00 1000 mg via INTRAVENOUS
  Filled 2020-10-10: qty 8

## 2020-10-10 MED ORDER — SODIUM CHLORIDE 0.9 % IV SOLN
1000.0000 mg | Freq: Every day | INTRAVENOUS | Status: AC
  Administered 2020-10-10: 1000 mg via INTRAVENOUS
  Filled 2020-10-10: qty 8

## 2020-10-10 MED ORDER — SODIUM CHLORIDE 0.9 % IV SOLN
1.0000 g | INTRAVENOUS | Status: DC
  Administered 2020-10-10 – 2020-10-11 (×2): 1 g via INTRAVENOUS
  Filled 2020-10-10 (×3): qty 10

## 2020-10-10 MED ORDER — PANTOPRAZOLE SODIUM 40 MG IV SOLR
40.0000 mg | INTRAVENOUS | Status: DC
  Administered 2020-10-10 – 2020-10-12 (×3): 40 mg via INTRAVENOUS
  Filled 2020-10-10 (×3): qty 40

## 2020-10-10 NOTE — Evaluation (Signed)
Physical Therapy Evaluation Patient Details Name: Lynn Alvarez MRN: [id number] DOB: [birth year] Today's Date: 10/10/2020   History of Present Illness  Patient is a 50 y.o. female with medical history significant for multiple sclerosis and hypertension who presents to the emergency room for evaluation of visual impairment involving her left eye which started two weeks prior. Patient now complains of persistent blurred vision in her left eye. Patient thought to have exacerbation of MS versus pseudo exacerbation of symptoms in the setting of a systemic infection-UTI    Clinical Impression  Patient agreeable to PT evaluation. 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 She reports muscle fatigue in her legs with prolonged activity at baseline.  Patient currently reports her vision is improving and this does not seem to impact functional independence with mobility. Normal strength in BLE with manual muscle testing, however patient reports LE fatigue with prolonged activity. Min guard assistance provided with ambulation due to narrow base of support and occasional unsteadiness. Recommend to continue PT to maximize independence and address functional limitations listed below.     Follow Up Recommendations Outpatient PT    Equipment Recommendations  Other (comment) (ongoing assessment)    Recommendations for Other Services       Precautions / Restrictions Precautions Precautions: Fall Restrictions Weight Bearing Restrictions: No      Mobility  Bed Mobility Overal bed mobility: Modified Independent             General bed mobility comments: no physical assistance provided but pt needing increased time and HOB elevated    Transfers Overall transfer level: Needs assistance Equipment used: 1 person hand held assist Transfers: Sit to/from Stand Sit to Stand: Min guard Stand pivot transfers: Min guard;Min assist        General transfer comment: Min guard for safety.  Ambulation/Gait Ambulation/Gait assistance: Min guard Gait Distance (Feet): 100 Feet Assistive device: None Gait Pattern/deviations: Narrow base of support;Step-through pattern Gait velocity: decreased   General Gait Details: patient required Min guard for safety with occasional supervision. visual impairment did not seem to impact functional independence or safety. mild unsteadiness likely more related to muscle fatigue. patient was educated on scanning environment for safety and fall prevention  Stairs            Wheelchair Mobility    Modified Rankin (Stroke Patients Only)       Balance Overall balance assessment: Needs assistance Sitting-balance support: Feet supported Sitting balance-Leahy Scale: Fair     Standing balance support: During functional activity Standing balance-Leahy Scale: Fair Standing balance comment: occasional staggering with ambulation                             Pertinent Vitals/Pain Pain Assessment: No/denies pain    Home Living Family/patient expects to be discharged to:: Private residence Living Arrangements: Spouse/significant other Available Help at Discharge: Available PRN/intermittently;Family Type of Home: House Home Access: Level entry     Home Layout: One level   Additional Comments: Pt reports she had a rollator but she couldn't use it at work so she just got rid of it.    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     Hand Dominance   Dominant Hand: Right    Extremity/Trunk Assessment  Upper Extremity Assessment Upper Extremity Assessment: Overall WFL for tasks assessed    Lower Extremity Assessment Lower Extremity Assessment: RLE deficits/detail;LLE deficits/detail RLE Deficits / Details: 5/5 strength dorsiflexion/plantarflexion,  knee extension, hip flexion, hip add/abd. Patient feels muscle fatigue with prolonged activity RLE Sensation: WNL LLE Deficits / Details: 5/5 strength dorsiflexion/plantarflexion, knee extension, hip flexion, hip add/abd. Patient feels muscle fatigue with prolonged activity LLE Sensation: WNL       Communication   Communication: No difficulties  Cognition Arousal/Alertness: Awake/alert Behavior During Therapy: WFL for tasks assessed/performed Overall Cognitive Status: Within Functional Limits for tasks assessed                                 General Comments: needs cuing for safety awareness      General Comments      Exercises     Assessment/Plan    PT Assessment Patient needs continued PT services  PT Problem List Decreased strength;Decreased activity tolerance;Decreased balance;Decreased mobility       PT Treatment Interventions Functional mobility training;Therapeutic activities;Therapeutic exercise;Balance training;Neuromuscular re-education;DME instruction;Gait training;Stair training    PT Goals (Current goals can be found in the Care Plan section)  Acute Rehab PT Goals Patient Stated Goal: to go back to work on Monday PT Goal Formulation: With patient Time For Goal Achievement: 10/24/20 Potential to Achieve Goals: Good    Frequency Min 2X/week   Barriers to discharge        Co-evaluation               AM-PAC PT "6 Clicks" Mobility  Outcome Measure Help needed turning from your back to your side while in a flat bed without using bedrails?: None Help needed moving from lying on your back to sitting on the side of a flat bed without using bedrails?: None Help needed moving to and from a bed to a chair (including a wheelchair)?: A Little Help needed standing up from a chair using your arms (e.g., wheelchair or bedside chair)?: A Little Help needed to walk in hospital room?: A Little Help needed climbing 3-5 steps with a railing? : A  Little 6 Click Score: 20    End of Session   Activity Tolerance: Patient tolerated treatment well Patient left: in bed;with call bell/phone within reach   PT Visit Diagnosis: Unsteadiness on feet (R26.81);Muscle weakness (generalized) (M62.81)    Time: 1610-9604 PT Time Calculation (min) (ACUTE ONLY): 28 min   Charges:   PT Evaluation $PT Eval Low Complexity: 1 Low PT Treatments $Therapeutic Activity: 8-22 mins        Donna Bernard, PT, MPT   Ina Homes 10/10/2020, 1:37 PM

## 2020-10-10 NOTE — Consult Note (Addendum)
Neurology Consultation  Reason for Consult: Blurred vision, history of MS Referring Physician: Greig Right PA/Dr. Scotty Court, EDP  CC: Left eye blurred vision  History is obtained from: Patient, chart  HPI: Lynn Alvarez is a 50 y.o. female past medical history of relapsing remitting MS on Tysabri, with last Tysabri infusion 09/28/2020.  She presents for evaluation of blurred vision in her left eye that started couple weeks ago. She reports that she went to bed on 517 and when she woke up from a nap, she felt that she could not see clearly out of her left eye.  She describes seeing a big black spot in the middle of her visual field but could see the periphery.  This gradually resolved but then her whole vision from the left eye became blurred.  She called her outpatient primary care provider, who recommended she come to the ER for further evaluation. Her MS history is documented in the care everywhere office visit which was reviewed from 12/08/2019- diagnosis 2005 at [facility], for symptom onset 2005, disease activity: 1 relapse since diagnosis in April 2020, prior high-dose steroids for relapse: No but has tried Medrol Dosepak once.  As for the disease modifying therapy was on no DMT from 2005 2020.  On necklace Mab since December 2020.  Continues to report that when she looks only to her left eye, it feels that she is looking at objects through the lens that is out of focus.  Case was discussed with me over the phone yesterday, after the ED had made the decision to admit the patient to hospitalist for recommendations on steroids.  Given potentially devastating effects from an active optic neuritis, I recommended she started on Solu-Medrol 1 g IV for 3 days.  I recommended MRI of the brain and orbits with and without contrast which was completed and shows findings compatible with multiple sclerosis but no active abnormal enhancement.  Reports ongoing bladder and bowel issues at this time.  Reports  increased frequency of urination and also sometimes bowel incontinence.  This is gradually progressive and not acute.  ROS: Full ROS was performed and is negative except as noted in the HPI.    Past Medical History:  Diagnosis Date  . Hypertension   . Multiple sclerosis (HCC)     Family History  Problem Relation Age of Onset  . Hypertension Mother     Social History:   reports that she has never smoked. She has never used smokeless tobacco. She reports previous alcohol use. She reports previous drug use.  Medications  Current Facility-Administered Medications:  .   stroke: mapping our early stages of recovery book, , Does not apply, Once, Agbata, Tochukwu, MD .  acetaminophen (TYLENOL) tablet 650 mg, 650 mg, Oral, Q4H PRN **OR** acetaminophen (TYLENOL) 160 MG/5ML solution 650 mg, 650 mg, Per Tube, Q4H PRN **OR** acetaminophen (TYLENOL) suppository 650 mg, 650 mg, Rectal, Q4H PRN, Agbata, Tochukwu, MD .  aspirin suppository 300 mg, 300 mg, Rectal, Daily **OR** aspirin tablet 325 mg, 325 mg, Oral, Daily, Agbata, Tochukwu, MD, 325 mg at 10/09/20 2058 .  NIFEdipine (PROCARDIA-XL/NIFEDICAL-XL) 24 hr tablet 30 mg, 30 mg, Oral, Daily, Agbata, Tochukwu, MD  Current Outpatient Medications:  .  carvedilol (COREG) 25 MG tablet, Take 25 mg by mouth daily., Disp: , Rfl:  .  triamterene-hydrochlorothiazide (DYAZIDE) 37.5-25 MG capsule, Take 1 capsule by mouth daily., Disp: , Rfl:    Exam: Current vital signs: BP (!) 150/88   Pulse (!) 55   Temp  98.2 F (36.8 C) (Oral)   Resp 14   Ht 5\' 4"  (1.626 m)   Wt 62.1 kg   SpO2 100%   BMI 23.52 kg/m  Vital signs in last 24 hours: Temp:  [98.2 F (36.8 C)] 98.2 F (36.8 C) (06/01 1620) Pulse Rate:  [54-63] 55 (06/02 0500) Resp:  [12-25] 14 (06/02 0500) BP: (128-170)/(72-101) 150/88 (06/02 0500) SpO2:  [98 %-100 %] 100 % (06/02 0500) Weight:  [62.1 kg] 62.1 kg (06/01 1620) General: Awake alert in no distress HEENT:  Normocephalic/atraumatic Lungs Clear Cardiovascular: Regular rate rhythm Abdomen nondistended nontender Extremities warm well perfused without edema Neurological exam Awake alert oriented x3 No dysarthria No aphasia Cranial nerves II to XII intact.  No evidence of RAPD.  Discs appear pale b/l Motor exam: 5/5 without drift in all 4 extremities Sensory exam: Some diminishing to vibration in both lower extremities, symmetric. Coordination: No dysmetria DTRs 3+ all over with downgoing toes.   Labs I have reviewed labs in epic and the results pertinent to this consultation are:  CBC    Component Value Date/Time   WBC 5.1 10/09/2020 1630   RBC 4.69 10/09/2020 1630   HGB 12.9 10/09/2020 1630   HCT 39.2 10/09/2020 1630   PLT 217 10/09/2020 1630   MCV 83.6 10/09/2020 1630   MCH 27.5 10/09/2020 1630   MCHC 32.9 10/09/2020 1630   RDW 13.5 10/09/2020 1630    CMP     Component Value Date/Time   NA 137 10/09/2020 1630   K 3.9 10/09/2020 1630   CL 104 10/09/2020 1630   CO2 21 (L) 10/09/2020 1630   GLUCOSE 95 10/09/2020 1630   BUN 21 (H) 10/09/2020 1630   CREATININE 1.14 (H) 10/09/2020 1630   CALCIUM 9.7 10/09/2020 1630   GFRNONAA 58 (L) 10/09/2020 1630    Lipid Panel     Component Value Date/Time   CHOL 209 (H) 10/10/2020 0350   TRIG 31 10/10/2020 0350   HDL 40 (L) 10/10/2020 0350   CHOLHDL 5.2 10/10/2020 0350   VLDL 6 10/10/2020 0350   LDLCALC 163 (H) 10/10/2020 0350  Urinalysis: Trace leuk esterase, positive nitrite, few bacteria, 11-20 WBCs.   Imaging I have reviewed the images obtained: MRI of the brain with and without contrast and MRI of the orbits with and without contrast: No optic nerve enhancement.  Left optic nerve T2 hyperintensity suspected with a limitation of motion artifact.  The optic nerves do appear subjectively small and therefore could reflect edema or sequela of prior inflammation that is prior episodic optic neuritis.  Moderate white matter disease  compatible with history of multiple sclerosis.  No abnormal enhancement.  MRI appearance are compatible with PML.  Assessment: 50 year old with a history of relapsing abdominal MS, on Tysabri, presenting with complaints of left eye blurred vision that has been going on since 09/24/2020. MR imaging does not show any abnormal enhancement-MR brain with and without contrast and MR orbits with and without contrast obtained and reviewed. There is subtle T2 hyperintensity of the left optic nerve and generally diminished size of both optic nerves likely sequela of prior insult. Given her extensive MS history, I would treat this as a possible exacerbation.  She also has a UTI and symptoms could reflect a pseudo exacerbation as well.  Impression: - Exacerbation of MS versus pseudo exacerbation of symptoms in the setting of a systemic infection-UTI. - Relapsing remitting multiple sclerosis -UTI  Recommendations:  3 days of high-dose steroids-1000 mg Solu-Medrol IV  daily-received the first dose yesterday night.  We will time the doses such that she can be discharged by the midday tomorrow.  Orders placed in the chart for the pharmacy to release medications on specified times  Treatment of UTI per primary team- I have relayed my plan to Dr. Rito Ehrlich secure chat.  PPI while on steroids  Outpatient neurology follow-up with her neurologist in 2 to 4 weeks  Outpatient ophthalmological exam   Neurology will be available as needed  -- Milon Dikes, MD Neurologist [facility] Pager: [phone]

## 2020-10-10 NOTE — Progress Notes (Signed)
*  PRELIMINARY RESULTS* Echocardiogram 2D Echocardiogram has been performed.  Joanette Gula Nilam Quakenbush 10/10/2020, 11:53 AM

## 2020-10-10 NOTE — Progress Notes (Signed)
PROGRESS NOTE  Lynn Alvarez GEX:[id number] DOB: 05/24/1970 DOA: 10/09/2020 PCP: Lyn Records, MD  HPI/Recap of past 24 hours: 50 y.o. female with medical history significant for multiple sclerosis and hypertension who presented on 6/1 to the emergency room for evaluation of visual impairment involving her left eye and admitted for possible optic neuritis to the hospitalist service.    Assessment/Plan: Principal Problem:   Visual impairment in patient with history of Multiple Sclerosis: Seen by neuro-hospitalist.  MRI notes no evidence of acute flair, however optic nerve notes hyperintensity.  Neuro recommending 3 days of IV steroids in case this may be optic neuritis.     Active Problems:   Hypertension: Cont procardia and added ACE/diuretic.    Bradycardia: on beta blocker.  Holding for now.  UTI: treating with 3 days of antibiotics.  Mild.  Checking culture, but given issues, will treat with rocephin  Code Status: Full code   Family Communication: Left message for family   Disposition Plan: discharge in next few dats   Consultants:  Neurology  Procedures:  none   Antimicrobials:  IV Rocephin 6/2-6/4   DVT prophylaxis:  SCD's  Level of care: Med-Surg   Objective: Vitals:   10/10/20 0322 10/10/20 0500  BP: (!) 137/93 (!) 150/88  Pulse: (!) 54 (!) 55  Resp: 14 14  Temp:    SpO2: 99% 100%   No intake or output data in the 24 hours ending 10/10/20 1605 Filed Weights   10/09/20 1620  Weight: 62.1 kg   Body mass index is 23.52 kg/m.  Exam:   General:  Alert and oriented x 3, NAD   Cardiovascular: reg rate and rhythm, S1S2   Respiratory: Clear to auscultation bilaterally   Abdomen: soft, non-tender, non-distended, +BS   Musculoskeletal: No clubbing, cyanosis, or edema   Skin: No skin breaks, tears or lesions  Psychiatry: Appropriate, no evidence of psychosis    Data Reviewed: CBC: Recent Labs  Lab 10/09/20 1630  WBC 5.1  HGB 12.9   HCT 39.2  MCV 83.6  PLT 217   Basic Metabolic Panel: Recent Labs  Lab 10/09/20 1630  NA 137  K 3.9  CL 104  CO2 21*  GLUCOSE 95  BUN 21*  CREATININE 1.14*  CALCIUM 9.7   GFR: Estimated Creatinine Clearance: 50.4 mL/min (A) (by C-G formula based on SCr of 1.14 mg/dL (H)). Liver Function Tests: No results for input(s): AST, ALT, ALKPHOS, BILITOT, PROT, ALBUMIN in the last 168 hours. No results for input(s): LIPASE, AMYLASE in the last 168 hours. No results for input(s): AMMONIA in the last 168 hours. Coagulation Profile: No results for input(s): INR, PROTIME in the last 168 hours. Cardiac Enzymes: No results for input(s): CKTOTAL, CKMB, CKMBINDEX, TROPONINI in the last 168 hours. BNP (last 3 results) No results for input(s): PROBNP in the last 8760 hours. HbA1C: No results for input(s): HGBA1C in the last 72 hours. CBG: No results for input(s): GLUCAP in the last 168 hours. Lipid Profile: Recent Labs    10/10/20 0350  CHOL 209*  HDL 40*  LDLCALC 163*  TRIG 31  CHOLHDL 5.2   Thyroid Function Tests: No results for input(s): TSH, T4TOTAL, FREET4, T3FREE, THYROIDAB in the last 72 hours. Anemia Panel: No results for input(s): VITAMINB12, FOLATE, FERRITIN, TIBC, IRON, RETICCTPCT in the last 72 hours. Urine analysis:    Component Value Date/Time   COLORURINE AMBER (A) 10/09/2020 0350   APPEARANCEUR CLOUDY (A) 10/09/2020 0350   LABSPEC 1.031 (H) 10/09/2020 0350  PHURINE 5.0 10/09/2020 0350   GLUCOSEU NEGATIVE 10/09/2020 0350   HGBUR NEGATIVE 10/09/2020 0350   BILIRUBINUR NEGATIVE 10/09/2020 0350   KETONESUR NEGATIVE 10/09/2020 0350   PROTEINUR NEGATIVE 10/09/2020 0350   NITRITE POSITIVE (A) 10/09/2020 0350   LEUKOCYTESUR TRACE (A) 10/09/2020 0350   Sepsis Labs: @LABRCNTIP (procalcitonin:4,lacticidven:4)  ) Recent Results (from the past 240 hour(s))  Resp Panel by RT-PCR (Flu A&B, Covid) Nasopharyngeal Swab     Status: None   Collection Time: 10/09/20  6:42  PM   Specimen: Nasopharyngeal Swab; Nasopharyngeal(NP) swabs in vial transport medium  Result Value Ref Range Status   SARS Coronavirus 2 by RT PCR NEGATIVE NEGATIVE Final    Comment: (NOTE) SARS-CoV-2 target nucleic acids are NOT DETECTED.  The SARS-CoV-2 RNA is generally detectable in upper respiratory specimens during the acute phase of infection. The lowest concentration of SARS-CoV-2 viral copies this assay can detect is 138 copies/mL. A negative result does not preclude SARS-Cov-2 infection and should not be used as the sole basis for treatment or other patient management decisions. A negative result may occur with  improper specimen collection/handling, submission of specimen other than nasopharyngeal swab, presence of viral mutation(s) within the areas targeted by this assay, and inadequate number of viral copies(<138 copies/mL). A negative result must be combined with clinical observations, patient history, and epidemiological information. The expected result is Negative.  Fact Sheet for Patients:  BloggerCourse.com  Fact Sheet for Healthcare Providers:  SeriousBroker.it  This test is no t yet approved or cleared by the [place] FDA and  has been authorized for detection and/or diagnosis of SARS-CoV-2 by FDA under an Emergency Use Authorization (EUA). This EUA will remain  in effect (meaning this test can be used) for the duration of the COVID-19 declaration under Section 564(b)(1) of the Act, 21 U.S.C.section 360bbb-3(b)(1), unless the authorization is terminated  or revoked sooner.       Influenza A by PCR NEGATIVE NEGATIVE Final   Influenza B by PCR NEGATIVE NEGATIVE Final    Comment: (NOTE) The Xpert Xpress SARS-CoV-2/FLU/RSV plus assay is intended as an aid in the diagnosis of influenza from Nasopharyngeal swab specimens and should not be used as a sole basis for treatment. Nasal washings and aspirates are  unacceptable for Xpert Xpress SARS-CoV-2/FLU/RSV testing.  Fact Sheet for Patients: BloggerCourse.com  Fact Sheet for Healthcare Providers: SeriousBroker.it  This test is not yet approved or cleared by the [place] FDA and has been authorized for detection and/or diagnosis of SARS-CoV-2 by FDA under an Emergency Use Authorization (EUA). This EUA will remain in effect (meaning this test can be used) for the duration of the COVID-19 declaration under Section 564(b)(1) of the Act, 21 U.S.C. section 360bbb-3(b)(1), unless the authorization is terminated or revoked.  Performed at [facility], [address], [place], [place] [address]       Studies: CT HEAD WO CONTRAST  Result Date: 10/09/2020 CLINICAL DATA:  Neuro deficit. Stroke suspected. Abnormal vision in the left eye for 2 weeks. EXAM: CT HEAD WITHOUT CONTRAST TECHNIQUE: Contiguous axial images were obtained from the base of the skull through the vertex without intravenous contrast. COMPARISON:  None. FINDINGS: Brain: No acute infarct, hemorrhage, or mass lesion is present. Periventricular and subcortical white matter hypoattenuation is slightly more prominent than expected for age. The ventricles are of normal size. No significant extraaxial fluid collection is present. The brainstem and cerebellum are within normal limits. Vascular: No hyperdense vessel or unexpected calcification. Skull: Calvarium is  intact. No focal lytic or blastic lesions are present. No significant extracranial soft tissue lesion is present. Sinuses/Orbits: The paranasal sinuses and mastoid air cells are clear. The globes and orbits are within normal limits. IMPRESSION: 1. Periventricular and subcortical white matter hypoattenuation is slightly more prominent than expected for age. This likely reflects the sequela of chronic microvascular ischemia. 2. No acute intracranial abnormality. Electronically  Signed   By: Marin Roberts M.D.   On: 10/09/2020 16:55   MR Brain W and Wo Contrast  Result Date: 10/09/2020 CLINICAL DATA:  Multiple sclerosis, left eye blurry vision EXAM: MRI HEAD AND ORBITS WITHOUT AND WITH CONTRAST TECHNIQUE: Multiplanar, multiecho pulse sequences of the brain and surrounding structures were obtained without and with intravenous contrast. Multiplanar, multiecho pulse sequences of the orbits and surrounding structures were obtained including fat saturation techniques, before and after intravenous contrast administration. CONTRAST:  30mL GADAVIST GADOBUTROL 1 MMOL/ML IV SOLN COMPARISON:  None. FINDINGS: MRI HEAD FINDINGS Brain: There is no acute infarction or intracranial hemorrhage. Patchy and confluent areas of T2 hyperintensity in the periventricular greater than subcortical white matter are compatible with history of multiple sclerosis. There is some involvement of the brainstem. Ex vacuo dilatation of the lateral ventricles. There is thinning of the corpus callosum. No abnormal enhancement. No definite evidence of PML. No prior imaging available for comparison. Vascular: Major vessel flow voids at the skull base are preserved. Skull and upper cervical spine: Marrow signal is within normal limits. Other: Sella is unremarkable.  Mastoid air cells are clear. MRI ORBITS FINDINGS Orbits: There is no proptosis. No intraorbital mass. Globes, extraocular muscles, and lacrimal glands are symmetric and unremarkable. There is no abnormal enhancement of the optic nerve sheath complexes. Left optic nerve T2 hyperintensity is suspected, but optic nerves also appear subjectively small. Visualized sinuses: No significant opacification. Soft tissues: Unremarkable. IMPRESSION: No optic nerve enhancement. Left optic nerve T2 hyperintensity suspected within limitation of motion artifact. The optic nerves do appear subjectively small and therefore, this could reflect edema or sequelae of prior  inflammation (i.e. prior episodes of optic neuritis). Moderate white matter disease compatible with history of multiple sclerosis. No evidence of active demyelination. Electronically Signed   By: Guadlupe Spanish M.D.   On: 10/09/2020 20:58   ECHOCARDIOGRAM COMPLETE  Result Date: 10/10/2020    ECHOCARDIOGRAM REPORT   Patient Name:   Lynn Alvarez Date of Exam: 10/10/2020 Medical Rec #:  [id number]     Height:       64.0 in Accession #:    [id number]    Weight:       137.0 lb Date of Birth:  April 27, 1971    BSA:          1.666 m Patient Age:    49 years      BP:           150/88 mmHg Patient Gender: F             HR:           61 bpm. Exam Location:  [facility] Procedure: 2D Echo, Color Doppler and Cardiac Doppler Indications:     I63.9 Stroke  History:         Patient has no prior history of Echocardiogram examinations.                  Multiple sclerosis; Risk Factors:Hypertension.  Sonographer:     Humphrey Rolls RDCS (AE) Referring Phys:  [id number] TGGYIRSW AGBATA Diagnosing Phys: Harold Hedge  MD  Sonographer Comments: Suboptimal parasternal window and suboptimal subcostal window. Image acquisition challenging due to respiratory motion. IMPRESSIONS  1. Left ventricular ejection fraction, by estimation, is 70 to 75%. The left ventricle has hyperdynamic function. The left ventricle has no regional wall motion abnormalities. There is moderate left ventricular hypertrophy. Left ventricular diastolic parameters are consistent with Grade I diastolic dysfunction (impaired relaxation).  2. Right ventricular systolic function is normal. The right ventricular size is normal.  3. The mitral valve is grossly normal. Trivial mitral valve regurgitation.  4. The aortic valve is grossly normal. Aortic valve regurgitation is trivial. FINDINGS  Left Ventricle: Left ventricular ejection fraction, by estimation, is 70 to 75%. The left ventricle has hyperdynamic function. The left ventricle has no regional wall motion abnormalities. The left  ventricular internal cavity size was normal in size. There is moderate left ventricular hypertrophy. Left ventricular diastolic parameters are consistent with Grade I diastolic dysfunction (impaired relaxation). Right Ventricle: The right ventricular size is normal. No increase in right ventricular wall thickness. Right ventricular systolic function is normal. Left Atrium: Left atrial size was normal in size. Right Atrium: Right atrial size was normal in size. Pericardium: There is no evidence of pericardial effusion. Mitral Valve: The mitral valve is grossly normal. Trivial mitral valve regurgitation. MV peak gradient, 2.9 mmHg. The mean mitral valve gradient is 1.0 mmHg. Tricuspid Valve: The tricuspid valve is grossly normal. Tricuspid valve regurgitation is trivial. Aortic Valve: The aortic valve is grossly normal. Aortic valve regurgitation is trivial. Aortic valve mean gradient measures 8.0 mmHg. Aortic valve peak gradient measures 15.8 mmHg. Aortic valve area, by VTI measures 1.66 cm. Pulmonic Valve: The pulmonic valve was not well visualized. Pulmonic valve regurgitation is trivial. Aorta: The aortic root is normal in size and structure. IAS/Shunts: The interatrial septum was not well visualized.  LEFT VENTRICLE PLAX 2D LVIDd:         3.60 cm  Diastology LVIDs:         2.00 cm  LV e' medial:    5.33 cm/s LV PW:         1.10 cm  LV E/e' medial:  11.0 LV IVS:        0.90 cm  LV e' lateral:   10.20 cm/s LVOT diam:     1.80 cm  LV E/e' lateral: 5.8 LV SV:         59 LV SV Index:   35 LVOT Area:     2.54 cm  RIGHT VENTRICLE RV Basal diam:  2.70 cm LEFT ATRIUM             Index       RIGHT ATRIUM          Index LA diam:        3.10 cm 1.86 cm/m  RA Area:     9.26 cm LA Vol (A2C):   38.0 ml 22.81 ml/m RA Volume:   15.50 ml 9.30 ml/m LA Vol (A4C):   35.5 ml 21.31 ml/m LA Biplane Vol: 38.7 ml 23.23 ml/m  AORTIC VALVE                    PULMONIC VALVE AV Area (Vmax):    1.79 cm     PV Vmax:       1.01 m/s AV  Area (Vmean):   1.83 cm     PV Vmean:      77.000 cm/s AV Area (VTI):     1.66  cm     PV VTI:        0.215 m AV Vmax:           199.00 cm/s  PV Peak grad:  4.1 mmHg AV Vmean:          135.000 cm/s PV Mean grad:  3.0 mmHg AV VTI:            0.352 m AV Peak Grad:      15.8 mmHg AV Mean Grad:      8.0 mmHg LVOT Vmax:         140.00 cm/s LVOT Vmean:        96.900 cm/s LVOT VTI:          0.230 m LVOT/AV VTI ratio: 0.65  AORTA Ao Root diam: 2.90 cm MITRAL VALVE MV Area (PHT): 2.53 cm    SHUNTS MV Area VTI:   1.97 cm    Systemic VTI:  0.23 m MV Peak grad:  2.9 mmHg    Systemic Diam: 1.80 cm MV Mean grad:  1.0 mmHg MV Vmax:       0.85 m/s MV Vmean:      48.6 cm/s MV Decel Time: 300 msec MV E velocity: 58.70 cm/s MV A velocity: 88.30 cm/s MV E/A ratio:  0.66 Harold Hedge MD Electronically signed by Harold Hedge MD Signature Date/Time: 10/10/2020/1:02:53 PM    Final    MR ORBITS W WO CONTRAST  Result Date: 10/09/2020 CLINICAL DATA:  Multiple sclerosis, left eye blurry vision EXAM: MRI HEAD AND ORBITS WITHOUT AND WITH CONTRAST TECHNIQUE: Multiplanar, multiecho pulse sequences of the brain and surrounding structures were obtained without and with intravenous contrast. Multiplanar, multiecho pulse sequences of the orbits and surrounding structures were obtained including fat saturation techniques, before and after intravenous contrast administration. CONTRAST:  98mL GADAVIST GADOBUTROL 1 MMOL/ML IV SOLN COMPARISON:  None. FINDINGS: MRI HEAD FINDINGS Brain: There is no acute infarction or intracranial hemorrhage. Patchy and confluent areas of T2 hyperintensity in the periventricular greater than subcortical white matter are compatible with history of multiple sclerosis. There is some involvement of the brainstem. Ex vacuo dilatation of the lateral ventricles. There is thinning of the corpus callosum. No abnormal enhancement. No definite evidence of PML. No prior imaging available for comparison. Vascular: Major vessel flow  voids at the skull base are preserved. Skull and upper cervical spine: Marrow signal is within normal limits. Other: Sella is unremarkable.  Mastoid air cells are clear. MRI ORBITS FINDINGS Orbits: There is no proptosis. No intraorbital mass. Globes, extraocular muscles, and lacrimal glands are symmetric and unremarkable. There is no abnormal enhancement of the optic nerve sheath complexes. Left optic nerve T2 hyperintensity is suspected, but optic nerves also appear subjectively small. Visualized sinuses: No significant opacification. Soft tissues: Unremarkable. IMPRESSION: No optic nerve enhancement. Left optic nerve T2 hyperintensity suspected within limitation of motion artifact. The optic nerves do appear subjectively small and therefore, this could reflect edema or sequelae of prior inflammation (i.e. prior episodes of optic neuritis). Moderate white matter disease compatible with history of multiple sclerosis. No evidence of active demyelination. Electronically Signed   By: Guadlupe Spanish M.D.   On: 10/09/2020 20:58    Scheduled Meds: .  stroke: mapping our early stages of recovery book   Does not apply Once  . aspirin  300 mg Rectal Daily   Or  . aspirin  325 mg Oral Daily  . NIFEdipine  30 mg Oral Daily  . pantoprazole (PROTONIX) IV  40 mg Intravenous Q24H  . triamterene-hydrochlorothiazide  1 capsule Oral Daily    Continuous Infusions: . methylPREDNISolone (SOLU-MEDROL) injection     Followed by  . [START ON 10/11/2020] methylPREDNISolone (SOLU-MEDROL) injection       LOS: 1 day     Hollice EspySendil K Adonica Fukushima, MD [facility]   10/10/2020, 4:05 PM

## 2020-10-10 NOTE — Progress Notes (Signed)
SLP Cancellation Note  Patient Details Name: Lynn Alvarez MRN: [id number] DOB: Aug 19, 1970   Cancelled treatment:       Reason Eval/Treat Not Completed: SLP screened, no needs identified, will sign off. Pt's speech, language, and cognition appear to be at baseline. Please reconsult if needs arise.   Marianne Golightly B. Murvin Natal, [facility], CCC-SLP Speech Language Pathologist  Leigh Aurora 10/10/2020, 10:25 AM

## 2020-10-10 NOTE — ED Notes (Signed)
Report received from [facility]. Patient care assumed. Patient/RN introduction complete. Will continue to monitor. Pt resting comfortably on the phone, no co pain or discomfort at this time.

## 2020-10-10 NOTE — ED Notes (Signed)
No change in condition will continue to monitor  

## 2020-10-10 NOTE — Evaluation (Signed)
Occupational Therapy Evaluation Patient Details Name: Lynn Alvarez MRN: [id number] DOB: 1971/02/06 Today's Date: 10/10/2020    History of Present Illness 51 y.o. female past medical history of relapsing remitting MS on Tysabri, with last Tysabri infusion 09/28/2020.  She presents for evaluation of blurred vision in her left eye that started couple weeks ago.   Clinical Impression   Patient presenting with decreased I in self care, balance, functional mobility/transfers, endurance, and safety awareness.  Patient reports being independent at home and working full time at [employer]. Pt lives with her husband who she reports can assist her as needed. She has been driving to [place] to work, gets packages/mail onto trunk , and does deliveries. She does not endorse seeing "black spot" in vision and is able to report colors correctly on white board but unable to read with that eye as she reports it being blurry. No diplopia. Patient currently functioning at min A without use of AD with episodes of scissoring and posterior LOB requiring assist to correct. Patient will benefit from acute OT to increase overall independence in the areas of ADLs, functional mobility, and safety awareness in order to safely discharge home with family.     Follow Up Recommendations  Home health OT;Supervision/Assistance - 24 hour    Equipment Recommendations  Tub/shower bench;Other (comment) (RW)       Precautions / Restrictions Precautions Precautions: Fall      Mobility Bed Mobility Overal bed mobility: Modified Independent             General bed mobility comments: no physical assistance provided but pt needing increased time and HOB elevated    Transfers Overall transfer level: Needs assistance Equipment used: 1 person hand held assist Transfers: Sit to/from [employer] Sit to Stand: Min guard Stand pivot transfers: Min guard;Min assist            Balance Overall balance  assessment: Needs assistance Sitting-balance support: Feet supported Sitting balance-Leahy Scale: Fair     Standing balance support: During functional activity Standing balance-Leahy Scale: Poor Standing balance comment: several LOBs and scissoring noted                           ADL either performed or assessed with clinical judgement   ADL Overall ADL's : Needs assistance/impaired Eating/Feeding: Independent   Grooming: Wash/dry hands;Wash/dry face;Supervision/safety;Standing               Lower Body Dressing: Min guard;Sit to/from stand   Toilet Transfer: Environmental education officer- Architect and Hygiene: Min guard;Sit to/from stand       Functional mobility during ADLs: Min guard;Minimal assistance General ADL Comments: one episode of scissoring with ambulation and several posterior LOB requiring min A. Pt reports furniture cruising at home.     Vision Baseline Vision/History: No visual deficits Patient Visual Report: Blurring of vision;Other (comment) Vision Assessment?: Vision impaired- to be further tested in functional context Additional Comments: Pt reports she no longer sees "black spot". She is able to visualize colors but unable to read with that eye open because of it being very blurry. Pt reports no diplopia this session.            Pertinent Vitals/Pain Pain Assessment: No/denies pain     Hand Dominance Right   Extremity/Trunk Assessment Upper Extremity Assessment Upper Extremity Assessment: Overall WFL for tasks assessed           Communication  Communication Communication: No difficulties   Cognition Arousal/Alertness: Awake/alert Behavior During Therapy: WFL for tasks assessed/performed Overall Cognitive Status: Within Functional Limits for tasks assessed                                 General Comments: needs cuing for safety awareness   General Comments               Home Living  Family/patient expects to be discharged to:: Private residence Living Arrangements: Spouse/significant other Available Help at Discharge: Available PRN/intermittently;Family Type of Home: House Home Access: Level entry     Home Layout: One level     Bathroom Shower/Tub: Tub/shower unit             Additional Comments: Pt reports she had a rollator but she couldn't use it at work so she just got rid of it.      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 She also endorses furniture walking. She does not have any equipment at home.        OT Problem List: Decreased strength;Impaired balance (sitting and/or standing);Decreased activity tolerance;Decreased knowledge of use of DME or AE;Decreased safety awareness      OT Treatment/Interventions: Self-care/ADL training;DME and/or AE instruction;Therapeutic activities;Balance training;Therapeutic exercise;Manual therapy;Visual/perceptual remediation/compensation;Neuromuscular education;Energy conservation;Patient/family education    OT Goals(Current goals can be found in the care plan section) Acute Rehab OT Goals Patient Stated Goal: to go home and back to work OT Goal Formulation: With patient Time For Goal Achievement: 10/24/20 Potential to Achieve Goals: Fair ADL Goals Pt Will Perform Grooming: with modified independence;standing Pt Will Perform Lower Body Dressing: with modified independence;sit to/from stand Pt Will Transfer to Toilet: with modified independence;ambulating Pt Will Perform Toileting - Clothing Manipulation and hygiene: with modified independence;sit to/from stand  OT Frequency: Min 2X/week   Barriers to D/C:    none known at this time          AM-PAC OT "6 Clicks" Daily Activity     Outcome Measure Help from another person eating meals?: None Help from another person taking care of  personal grooming?: A Little Help from another person toileting, which includes using toliet, bedpan, or urinal?: A Little Help from another person bathing (including washing, rinsing, drying)?: A Little Help from another person to put on and taking off regular upper body clothing?: None Help from another person to put on and taking off regular lower body clothing?: A Little 6 Click Score: 20   End of Session Nurse Communication: Mobility status  Activity Tolerance: Patient tolerated treatment well Patient left: in bed;with call bell/phone within reach  OT Visit Diagnosis: Unsteadiness on feet (R26.81);Muscle weakness (generalized) (M62.81)                Time: 3419-3790 OT Time Calculation (min): 32 min Charges:  OT General Charges $OT Visit: 1 Visit OT Evaluation $OT Eval Moderate Complexity: 1 Mod OT Treatments $Self Care/Home Management : 23-37 mins  Jackquline Denmark, MS, OTR/L , CBIS ascom [phone]  10/10/20, 1:09 PM

## 2020-10-11 DIAGNOSIS — E440 Moderate protein-calorie malnutrition: Secondary | ICD-10-CM | POA: Insufficient documentation

## 2020-10-11 LAB — HEMOGLOBIN A1C
Hgb A1c MFr Bld: 5.5 % (ref 4.8–5.6)
Mean Plasma Glucose: 111 mg/dL

## 2020-10-11 MED ORDER — ADULT MULTIVITAMIN W/MINERALS CH
1.0000 | ORAL_TABLET | Freq: Every day | ORAL | Status: DC
  Administered 2020-10-11 – 2020-10-12 (×2): 1 via ORAL
  Filled 2020-10-11 (×2): qty 1

## 2020-10-11 MED ORDER — BOOST / RESOURCE BREEZE PO LIQD CUSTOM
1.0000 | Freq: Three times a day (TID) | ORAL | Status: DC
  Administered 2020-10-11 – 2020-10-12 (×5): 1 via ORAL

## 2020-10-11 NOTE — Progress Notes (Signed)
PROGRESS NOTE  Lynn Alvarez IEP:[id number] DOB: 03-22-1971 DOA: 10/09/2020 PCP: Lyn Records, MD  HPI/Recap of past 24 hours: 50 y.o. female with medical history significant for multiple sclerosis and hypertension who presented on 6/1 to the emergency room for evaluation of visual impairment involving her left eye and admitted for possible optic neuritis to the hospitalist service.    Patient is on day 2/3 of steroids.  States that she feels like her vision is getting somewhat better and less blurry.  No other complaints.  Assessment/Plan: Principal Problem:   Visual impairment in patient with history of Multiple Sclerosis: Seems to be improving.  Seen by neuro-hospitalist.  MRI notes no evidence of acute flair, however optic nerve notes hyperintensity.  Neuro recommending 3 days of IV steroids in case this may be optic neuritis.     Active Problems:   Hypertension: Cont procardia and added ACE/diuretic.    Bradycardia: on beta blocker.  Holding for now.  UTI: treating with 3 days of antibiotics.  Mild.  Checking culture, but given issues, will treat with rocephin  Moderate protein calorie malnutrition: Nutrition Status: Nutrition Problem: Moderate Malnutrition Etiology: chronic illness Signs/Symptoms: mild fat depletion,moderate fat depletion,mild muscle depletion,moderate muscle depletion Interventions: Boost Breeze,Magic cup,MVI   Code Status: Full code   Family Communication: Left message for family   Disposition Plan: discharge likely tomorrow after steroids completed   Consultants:  Neurology  Procedures:  none   Antimicrobials:  IV Rocephin 6/2-6/4   DVT prophylaxis:  SCD's  Level of care: Med-Surg   Objective: Vitals:   10/11/20 1109 10/11/20 1546  BP: 140/68 (!) 142/87  Pulse: 69 (!) 59  Resp: 16 16  Temp: 98.2 F (36.8 C) 98 F (36.7 C)  SpO2: 99% 96%    Intake/Output Summary (Last 24 hours) at 10/11/2020 1555 Last data filed at  10/11/2020 1013 Gross per 24 hour  Intake 240 ml  Output --  Net 240 ml   Filed Weights   10/09/20 1620  Weight: 62.1 kg   Body mass index is 23.52 kg/m.  Exam:   General:  Alert and oriented x 3, NAD   Cardiovascular: reg rate and rhythm, S1S2   Respiratory: Clear to auscultation bilaterally   Abdomen: soft, non-tender, non-distended, +BS   Musculoskeletal: No clubbing, cyanosis, or edema   Skin: No skin breaks, tears or lesions  Psychiatry: Appropriate, no evidence of psychosis    Data Reviewed: CBC: Recent Labs  Lab 10/09/20 1630  WBC 5.1  HGB 12.9  HCT 39.2  MCV 83.6  PLT 217   Basic Metabolic Panel: Recent Labs  Lab 10/09/20 1630  NA 137  K 3.9  CL 104  CO2 21*  GLUCOSE 95  BUN 21*  CREATININE 1.14*  CALCIUM 9.7   GFR: Estimated Creatinine Clearance: 50.4 mL/min (A) (by C-G formula based on SCr of 1.14 mg/dL (H)). Liver Function Tests: No results for input(s): AST, ALT, ALKPHOS, BILITOT, PROT, ALBUMIN in the last 168 hours. No results for input(s): LIPASE, AMYLASE in the last 168 hours. No results for input(s): AMMONIA in the last 168 hours. Coagulation Profile: No results for input(s): INR, PROTIME in the last 168 hours. Cardiac Enzymes: No results for input(s): CKTOTAL, CKMB, CKMBINDEX, TROPONINI in the last 168 hours. BNP (last 3 results) No results for input(s): PROBNP in the last 8760 hours. HbA1C: Recent Labs    10/10/20 0350  HGBA1C 5.5   CBG: No results for input(s): GLUCAP in the last 168 hours. Lipid  Profile: Recent Labs    10/10/20 0350  CHOL 209*  HDL 40*  LDLCALC 163*  TRIG 31  CHOLHDL 5.2   Thyroid Function Tests: No results for input(s): TSH, T4TOTAL, FREET4, T3FREE, THYROIDAB in the last 72 hours. Anemia Panel: No results for input(s): VITAMINB12, FOLATE, FERRITIN, TIBC, IRON, RETICCTPCT in the last 72 hours. Urine analysis:    Component Value Date/Time   COLORURINE AMBER (A) 10/09/2020 0350   APPEARANCEUR  CLOUDY (A) 10/09/2020 0350   LABSPEC 1.031 (H) 10/09/2020 0350   PHURINE 5.0 10/09/2020 0350   GLUCOSEU NEGATIVE 10/09/2020 0350   HGBUR NEGATIVE 10/09/2020 0350   BILIRUBINUR NEGATIVE 10/09/2020 0350   KETONESUR NEGATIVE 10/09/2020 0350   PROTEINUR NEGATIVE 10/09/2020 0350   NITRITE POSITIVE (A) 10/09/2020 0350   LEUKOCYTESUR TRACE (A) 10/09/2020 0350   Sepsis Labs: @LABRCNTIP (procalcitonin:4,lacticidven:4)  ) Recent Results (from the past 240 hour(s))  Resp Panel by RT-PCR (Flu A&B, Covid) Nasopharyngeal Swab     Status: None   Collection Time: 10/09/20  6:42 PM   Specimen: Nasopharyngeal Swab; Nasopharyngeal(NP) swabs in vial transport medium  Result Value Ref Range Status   SARS Coronavirus 2 by RT PCR NEGATIVE NEGATIVE Final    Comment: (NOTE) SARS-CoV-2 target nucleic acids are NOT DETECTED.  The SARS-CoV-2 RNA is generally detectable in upper respiratory specimens during the acute phase of infection. The lowest concentration of SARS-CoV-2 viral copies this assay can detect is 138 copies/mL. A negative result does not preclude SARS-Cov-2 infection and should not be used as the sole basis for treatment or other patient management decisions. A negative result may occur with  improper specimen collection/handling, submission of specimen other than nasopharyngeal swab, presence of viral mutation(s) within the areas targeted by this assay, and inadequate number of viral copies(<138 copies/mL). A negative result must be combined with clinical observations, patient history, and epidemiological information. The expected result is Negative.  Fact Sheet for Patients:  12/09/20  Fact Sheet for Healthcare Providers:  BloggerCourse.com  This test is no t yet approved or cleared by the [employer]ious[employer].[place] FDA and  has been authorized for detection and/or diagnosis of SARS-CoV-2 by FDA under an Emergency Use Authorization (EUA).  This EUA will remain  in effect (meaning this test can be used) for the duration of the COVID-19 declaration under Section 564(b)(1) of the Act, 21 U.S.C.section 360bbb-3(b)(1), unless the authorization is terminated  or revoked sooner.       Influenza A by PCR NEGATIVE NEGATIVE Final   Influenza B by PCR NEGATIVE NEGATIVE Final    Comment: (NOTE) The Xpert Xpress SARS-CoV-2/FLU/RSV plus assay is intended as an aid in the diagnosis of influenza from Nasopharyngeal swab specimens and should not be used as a sole basis for treatment. Nasal washings and aspirates are unacceptable for Xpert Xpress SARS-CoV-2/FLU/RSV testing.  Fact Sheet for Patients: [place]  Fact Sheet for Healthcare Providers: BloggerCourse.com  This test is not yet approved or cleared by the [employer]ious[employer]ker.[place] FDA and has been authorized for detection and/or diagnosis of SARS-CoV-2 by FDA under an Emergency Use Authorization (EUA). This EUA will remain in effect (meaning this test can be used) for the duration of the COVID-19 declaration under Section 564(b)(1) of the Act, 21 U.S.C. section 360bbb-3(b)(1), unless the authorization is terminated or revoked.  Performed at [facility], [address], [place], [place]       Studies: No results found.  Scheduled Meds: .  stroke: mapping our early stages of recovery book  Does not apply Once  . aspirin  300 mg Rectal Daily   Or  . aspirin  325 mg Oral Daily  . feeding supplement  1 Container Oral TID BM  . multivitamin with minerals  1 tablet Oral Daily  . NIFEdipine  30 mg Oral Daily  . pantoprazole (PROTONIX) IV  40 mg Intravenous Q24H  . triamterene-hydrochlorothiazide  1 tablet Oral Daily    Continuous Infusions: . cefTRIAXone (ROCEPHIN)  IV Stopped (10/10/20 1907)     LOS: 2 days     Hollice Espy, MD [facility]   10/11/2020, 3:55 PM

## 2020-10-11 NOTE — Progress Notes (Signed)
Physical Therapy Treatment Patient Details Name: Lynn Alvarez MRN: [id number] DOB: [birth year] Today's Date: 10/11/2020    History of Present Illness Patient is a 50 y.o. female with medical history significant for multiple sclerosis and hypertension who presents to the emergency room for evaluation of visual impairment involving her left eye which started two weeks prior. Patient now complains of persistent blurred vision in her left eye. Patient thought to have exacerbation of MS versus pseudo exacerbation of symptoms in the setting of a systemic infection-UTI    PT Comments    Pt up walking in room upon arrival.  Makes x 1 lap with no AD and generally unsteady gait.  She does go up/down 6 steps with 1 rail as she will need to do steps at work.  She reaches for railings when available with several imbalances that she recovers and c/o quad weakness.  After seated rest and donning shoes, she makes x 1 lap with SPC.  Gait is improved with SPC but remains unsteady.  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  Discussed job tasks and challenges.  Pt does state husband pressures her to go back to work as Education officer, community are a concern for them.  Encouraged to consider overall safety of driving and physical demands that will be needed.   Follow Up Recommendations  Outpatient PT     Equipment Recommendations  Other (comment) (ongoing assessment)    Recommendations for Other Services       Precautions / Restrictions Precautions Precautions: Fall Restrictions Weight Bearing Restrictions: No    Mobility  Bed Mobility Overal bed mobility: Modified Independent                  Transfers Overall transfer level: Modified independent                  Ambulation/Gait Ambulation/Gait assistance: Min guard Gait Distance (Feet): 180 Feet Assistive device: None;Straight cane Gait Pattern/deviations: Narrow base of support;Step-through pattern Gait  velocity: decreased   General Gait Details: unsteady x 1 lap without AD.  Improves with SPC   Stairs Stairs: Yes Stairs assistance: Min guard;Min assist Stair Management: One rail Right Number of Stairs: 6 General stair comments: unsteady with quad weakness noted   Wheelchair Mobility    Modified Rankin (Stroke Patients Only)       Balance Overall balance assessment: Needs assistance Sitting-balance support: Feet supported Sitting balance-Leahy Scale: Fair     Standing balance support: During functional activity Standing balance-Leahy Scale: Fair Standing balance comment: occasional staggering with ambulation                            Cognition Arousal/Alertness: Awake/alert Behavior During Therapy: WFL for tasks assessed/performed Overall Cognitive Status: Within Functional Limits for tasks assessed                                        Exercises      General Comments        Pertinent Vitals/Pain Pain Assessment: No/denies pain    Home Living                      Prior Function            PT Goals (current goals can now be found in the care  plan section) Progress towards PT goals: Progressing toward goals    Frequency    Min 2X/week      PT Plan      Co-evaluation              AM-PAC PT "6 Clicks" Mobility   Outcome Measure  Help needed turning from your back to your side while in a flat bed without using bedrails?: None Help needed moving from lying on your back to sitting on the side of a flat bed without using bedrails?: None Help needed moving to and from a bed to a chair (including a wheelchair)?: None Help needed standing up from a chair using your arms (e.g., wheelchair or bedside chair)?: None Help needed to walk in hospital room?: A Little Help needed climbing 3-5 steps with a railing? : A Little 6 Click Score: 22    End of Session   Activity Tolerance: Patient tolerated treatment  well Patient left: in bed;with call bell/phone within reach   PT Visit Diagnosis: Unsteadiness on feet (R26.81);Muscle weakness (generalized) (M62.81)     Time: 0233-4356 PT Time Calculation (min) (ACUTE ONLY): 23 min  Charges:  $Gait Training: 23-37 mins                    Danielle Dess, PTA 10/11/20, 9:57 AM

## 2020-10-11 NOTE — Progress Notes (Signed)
Initial Nutrition Assessment  DOCUMENTATION CODES:  Non-severe (moderate) malnutrition in context of chronic illness  INTERVENTION:  Add Boost Breeze po TID, each supplement provides 250 kcal and 9 grams of protein.  Add Magic cup TID with meals, each supplement provides 290 kcal and 9 grams of protein.  Add MVI with minerals daily.  NUTRITION DIAGNOSIS:  Moderate Malnutrition related to chronic illness as evidenced by mild fat depletion,moderate fat depletion,mild muscle depletion,moderate muscle depletion.  GOAL:  Patient will meet greater than or equal to 90% of their needs  MONITOR:  PO intake,Supplement acceptance,Labs,Weight trends,I & O's  REASON FOR ASSESSMENT:  Malnutrition Screening Tool    ASSESSMENT:  50 yo female with a PMH of MS and HTN who presents with visual impairment.  Spoke with pt at bedside. Pt reports that she lost her appetite when she had COVID-19 and had persistent nausea and emesis for weeks. She reports losing a lot of weight during this time. Her appetite is slowly starting to come back, and she reports feeling like she is gaining weight back. Per Epic, pt ate 100% of breakfast this morning.  Per pt, her UBW is 173 lbs and she reports that she has been losing weight for about 6 months. No weight history in Epic. Per Care Everywhere, pt's weight was 129 lbs on 05/19/2020, when she was diagnosed with COVID-19. Pt has since gained 11 lbs, as she currently weighs 137 lbs.  Pt reports weak and shaky hands and legs on occasion. On exam, pt has some mild-moderate depletion in fat and muscle stores.  Given above information, pt moderately malnourished in the chronic setting.  Recommend adding Boost Breeze TID (per pt preference) and Magic Cup TID to promote caloric and protein intake. Also recommend adding MVI with minerals daily.  Medications: reviewed; Protonix, ceftriaxone, Solu-Medrol  Labs: reviewed  NUTRITION - FOCUSED PHYSICAL EXAM: Flowsheet Row  Most Recent Value  Orbital Region Moderate depletion  Upper Arm Region Mild depletion  Thoracic and Lumbar Region No depletion  Buccal Region Mild depletion  Temple Region Mild depletion  Clavicle Bone Region Moderate depletion  Clavicle and Acromion Bone Region Mild depletion  Scapular Bone Region Unable to assess  Dorsal Hand Mild depletion  Patellar Region Mild depletion  Anterior Thigh Region Mild depletion  Posterior Calf Region Mild depletion  Edema (RD Assessment) None  Hair Reviewed  Eyes Reviewed  Mouth Reviewed  Skin Reviewed  Nails Reviewed     Diet Order:   Diet Order            Diet 2 gram sodium Room service appropriate? Yes; Fluid consistency: Thin  Diet effective now                EDUCATION NEEDS:  Education needs have been addressed  Skin:  Skin Assessment: Reviewed RN Assessment  Last BM:  PTA/unknown  Height:  Ht Readings from Last 1 Encounters:  10/09/20 5\' 4"  (1.626 m)   Weight:  Wt Readings from Last 1 Encounters:  10/09/20 62.1 kg   Ideal Body Weight:  54.5 kg  BMI:  Body mass index is 23.52 kg/m.  Estimated Nutritional Needs:  Kcal:  1900-2100 Protein:  80-95 grams Fluid:  >1.9 L  12/09/20, RD, LDN Registered Dietitian After Hours/Weekend Pager # in [place]

## 2020-10-12 DIAGNOSIS — H469 Unspecified optic neuritis: Secondary | ICD-10-CM

## 2020-10-12 NOTE — Progress Notes (Addendum)
Neurology Progress Note   S:// Seen and examined Left eye acuity subjectively significantly improved   O:// Current vital signs: BP (!) 149/85 (BP Location: Left Arm)   Pulse (!) 57   Temp 98.1 F (36.7 C) (Oral)   Resp 16   Ht 5\' 4"  (1.626 m)   Wt 62.1 kg   SpO2 99%   BMI 23.52 kg/m  Vital signs in last 24 hours: Temp:  [97.7 F (36.5 C)-98.4 F (36.9 C)] 98.1 F (36.7 C) (06/04 0513) Pulse Rate:  [57-69] 57 (06/04 0513) Resp:  [16] 16 (06/04 0513) BP: (140-164)/(68-92) 149/85 (06/04 0513) SpO2:  [96 %-99 %] 99 % (06/04 0513) General: Awake alert in no distress HEENT: Normocephalic/atraumatic Lungs Clear Cardiovascular: Regular rate rhythm Abdomen nondistended nontender Extremities warm well perfused without edema Neurological exam Awake alert oriented x3 No dysarthria No aphasia Cranial nerves III to XII intact.  No evidence of RAPD.  Discs appear pale b/l, subjectively improved VA OS Motor exam: 5/5 without drift in all 4 extremities Sensory exam: Some diminishing to vibration in both lower extremities, symmetric. Coordination: No dysmetria DTRs 3+ all over with downgoing toes. Somewhat improved from 2 days ago in terms of left eye visual acuity  Medications  Current Facility-Administered Medications:  .   stroke: mapping our early stages of recovery book, , Does not apply, Once, Agbata, Tochukwu, MD .  acetaminophen (TYLENOL) tablet 650 mg, 650 mg, Oral, Q4H PRN **OR** acetaminophen (TYLENOL) 160 MG/5ML solution 650 mg, 650 mg, Per Tube, Q4H PRN **OR** acetaminophen (TYLENOL) suppository 650 mg, 650 mg, Rectal, Q4H PRN, Agbata, Tochukwu, MD .  aspirin suppository 300 mg, 300 mg, Rectal, Daily **OR** aspirin tablet 325 mg, 325 mg, Oral, Daily, Agbata, Tochukwu, MD, 325 mg at 10/11/20 1125 .  cefTRIAXone (ROCEPHIN) 1 g in sodium chloride 0.9 % 100 mL IVPB, 1 g, Intravenous, Q24H, 12/11/20, MD, Last Rate: 200 mL/hr at 10/11/20 1654, 1 g at 10/11/20  1654 .  feeding supplement (BOOST / RESOURCE BREEZE) liquid 1 Container, 1 Container, Oral, TID BM, 12/11/20, MD, 1 Container at 10/11/20 2146 .  multivitamin with minerals tablet 1 tablet, 1 tablet, Oral, Daily, 2147, MD, 1 tablet at 10/11/20 1125 .  NIFEdipine (PROCARDIA-XL/NIFEDICAL-XL) 24 hr tablet 30 mg, 30 mg, Oral, Daily, Agbata, Tochukwu, MD, 30 mg at 10/11/20 1125 .  pantoprazole (PROTONIX) injection 40 mg, 40 mg, Intravenous, Q24H, 12/11/20, MD, 40 mg at 10/11/20 1124 .  triamterene-hydrochlorothiazide (MAXZIDE-25) 37.5-25 MG per tablet 1 tablet, 1 tablet, Oral, Daily, 02-[birth year]-1992, MD, 1 tablet at 10/11/20 1125 Labs CBC    Component Value Date/Time   WBC 5.1 10/09/2020 1630   RBC 4.69 10/09/2020 1630   HGB 12.9 10/09/2020 1630   HCT 39.2 10/09/2020 1630   PLT 217 10/09/2020 1630   MCV 83.6 10/09/2020 1630   MCH 27.5 10/09/2020 1630   MCHC 32.9 10/09/2020 1630   RDW 13.5 10/09/2020 1630    CMP     Component Value Date/Time   NA 137 10/09/2020 1630   K 3.9 10/09/2020 1630   CL 104 10/09/2020 1630   CO2 21 (L) 10/09/2020 1630   GLUCOSE 95 10/09/2020 1630   BUN 21 (H) 10/09/2020 1630   CREATININE 1.14 (H) 10/09/2020 1630   CALCIUM 9.7 10/09/2020 1630   GFRNONAA 58 (L) 10/09/2020 1630   Imaging I have reviewed the images obtained: MRI of the brain with and without contrast and MRI of the  orbits with and without contrast: No optic nerve enhancement.  Left optic nerve T2 hyperintensity suspected with a limitation of motion artifact.  The optic nerves do appear subjectively small and therefore could reflect edema or sequela of prior inflammation that is prior episodic optic neuritis.  Moderate white matter disease compatible with history of multiple sclerosis.  No abnormal enhancement.  MRI appearance are compatible with PML.  Assessment: 50 year old with a history of relapsing abdominal MS, on Tysabri, presenting with complaints of left  eye blurred vision that has been going on since 09/24/2020. MR imaging did not show any abnormal enhancement-MR brain with and without contrast and MR orbits with and without contrast obtained and reviewed. Subtle T2 hyperintensity of the left optic nerve and generally diminished size of both optic nerves likely sequela of prior insult. Given her extensive MS history, treating this as a possible exacerbation/optic neuritis.  She also has a UTI and symptoms could reflect a pseudo exacerbation as well.  Responding to and tolerating steroids well with subjective improvement in visual acuity  Impression: - Exacerbation of MS versus pseudo exacerbation of symptoms in the setting of a systemic infection-UTI. - h/o Relapsing remitting multiple sclerosis - on Natalizumab  -UTI  Recommendations:  Complete 3 days of IVMP  Treatment of UTI per primary team.  PPI while on steroids  Outpatient neurology follow-up with her neurologist in 2 to 4 weeks  Outpatient ophthalmological exam-in 1-2 weeks  D/w Dr. Rito Ehrlich  -- Milon Dikes, MD Neurologist [facility] Pager: [phone]

## 2020-10-12 NOTE — TOC Transition Note (Signed)
Transition of Care [facility]) - CM/SW Discharge Note   Patient Details  Name: Lynn Alvarez MRN: [id number] Date of Birth: Feb 21, 1971  [facility]) CM/SW Contact:  Gildardo Griffes, LCSW Phone Number: 10/12/2020, 2:54 PM   Clinical Narrative:     CSW agreeable to recommendation of outpatient PT. Reports hx of going to [facility] off of [address]. Referral faxed to them at [phone]. Due to weekend, CSW informed patient if she has any problems with referral to go through her PCP as well.   Patient is declining rolling walker or tub bench at this time, encouraged to reach out to her PCP should she change her mind.   Final next level of care: Home/Self Care Barriers to Discharge: No Barriers Identified   Patient Goals and CMS Choice Patient states their goals for this hospitalization and ongoing recovery are:: to go home CMS Medicare.gov Compare Post Acute Care list provided to:: Patient Choice offered to / list presented to : Patient  Discharge Placement                       Discharge Plan and Services                                     Social Determinants of Health (SDOH) Interventions     Readmission Risk Interventions No flowsheet data found.

## 2020-10-12 NOTE — Discharge Summary (Signed)
Discharge Summary  Lynn Alvarez GNF:[id number] DOB: 11-30-1970  PCP: Lyn Records, MD  Admit date: 10/09/2020 Discharge date: 10/12/2020  Time spent: 25 minutes  Recommendations for Outpatient Follow-up:  1. Patient advised to follow-up with her neurologist in 2 to 4 weeks. 2. Patient advised to schedule an eye exam in the next 2 weeks with ophthalmology 3. Patient advised to not return to work until 6/13 to ensure improvement in vision, safety to drive  Discharge Diagnoses:  Active Hospital Problems   Diagnosis Date Noted  . Visual impairment 10/09/2020  . Malnutrition of moderate degree 10/11/2020  . Bradycardia 10/10/2020  . Hypertension   . Multiple sclerosis [facility])     Resolved Hospital Problems  No resolved problems to display.    Discharge Condition: Improved, being discharged home  Diet recommendation: Low-sodium  Vitals:   10/12/20 0055 10/12/20 0513  BP: (!) 142/82 (!) 149/85  Pulse: (!) 58 (!) 57  Resp: 16 16  Temp: 97.7 F (36.5 C) 98.1 F (36.7 C)  SpO2: 96% 99%    History of present illness:  50 y.o.femalewith medical history significant formultiple sclerosis and hypertension who presented on 6/1 to the emergency room for evaluation of visual impairment involving her lefteye and admitted for possible optic neuritis to the hospitalist service.      Hospital Course:    Visual impairment in patient with history of Multiple Sclerosis: Patient seen by neuro hospitalist.  Placed on 3 days of IV steroids for suspicion of optic neuritis..  Improved.  MRI notes no evidence of acute flair, however optic nerve notes hyperintensity.      Active Problems:   Hypertension: Patient will resume her home medications upon discharge.    Bradycardia: Normally on beta-blocker.  During part of patient's hospitalization, had some bradycardia so this was held  UTI: treating with 3 days of antibiotics.    Received 3 days of IV Rocephin.  Cultures not back by  time of discharge.  Moderate protein calorie malnutrition: Nutrition Status: Nutrition Problem: Moderate Malnutrition Etiology: chronic illness Signs/Symptoms: mild fat depletion,moderate fat depletion,mild muscle depletion,moderate muscle depletion Interventions: Boost Breeze,Magic cup,MVI  Procedures:  None  Consultations:  Neurology  Discharge Exam: BP (!) 149/85 (BP Location: Left Arm)   Pulse (!) 57   Temp 98.1 F (36.7 C) (Oral)   Resp 16   Ht 5\' 4"  (1.626 m)   Wt 62.1 kg   SpO2 99%   BMI 23.52 kg/m   General: Alert and oriented x3, no acute distress Cardiovascular: Regular rate and rhythm, S1-S2 Respiratory: Clear to auscultation bilaterally  Discharge Instructions You were cared for by a hospitalist during your hospital stay. If you have any questions about your discharge medications or the care you received while you were in the hospital after you are discharged, you can call the unit and asked to speak with the hospitalist on call if the hospitalist that took care of you is not available. Once you are discharged, your primary care physician will handle any further medical issues. Please note that NO REFILLS for any discharge medications will be authorized once you are discharged, as it is imperative that you return to your primary care physician (or establish a relationship with a primary care physician if you do not have one) for your aftercare needs so that they can reassess your need for medications and monitor your lab values.   Allergies as of 10/12/2020      Reactions   Lisinopril Cough  Medication List    TAKE these medications   carvedilol 25 MG tablet Commonly known as: COREG Take 25 mg by mouth daily.   triamterene-hydrochlorothiazide 37.5-25 MG capsule Commonly known as: DYAZIDE Take 1 capsule by mouth daily.      Allergies  Allergen Reactions  . Lisinopril Cough    Follow-up Information    Neurologist. Schedule an appointment as soon  as possible for a visit in 3 week(s).   Contact information: Follow-up with your neurologist in 2 to 4 weeks.       Ophthalmologist Follow up in 2 week(s).   Contact information: Follow-up with your ophthalmologist or if you do not have one, schedule an appointment for an eye exam in 2 weeks               The results of significant diagnostics from this hospitalization (including imaging, microbiology, ancillary and laboratory) are listed below for reference.    Significant Diagnostic Studies: CT HEAD WO CONTRAST  Result Date: 10/09/2020 CLINICAL DATA:  Neuro deficit. Stroke suspected. Abnormal vision in the left eye for 2 weeks. EXAM: CT HEAD WITHOUT CONTRAST TECHNIQUE: Contiguous axial images were obtained from the base of the skull through the vertex without intravenous contrast. COMPARISON:  None. FINDINGS: Brain: No acute infarct, hemorrhage, or mass lesion is present. Periventricular and subcortical white matter hypoattenuation is slightly more prominent than expected for age. The ventricles are of normal size. No significant extraaxial fluid collection is present. The brainstem and cerebellum are within normal limits. Vascular: No hyperdense vessel or unexpected calcification. Skull: Calvarium is intact. No focal lytic or blastic lesions are present. No significant extracranial soft tissue lesion is present. Sinuses/Orbits: The paranasal sinuses and mastoid air cells are clear. The globes and orbits are within normal limits. IMPRESSION: 1. Periventricular and subcortical white matter hypoattenuation is slightly more prominent than expected for age. This likely reflects the sequela of chronic microvascular ischemia. 2. No acute intracranial abnormality. Electronically Signed   By: Marin Roberts M.D.   On: 10/09/2020 16:55   MR Brain W and Wo Contrast  Result Date: 10/09/2020 CLINICAL DATA:  Multiple sclerosis, left eye blurry vision EXAM: MRI HEAD AND ORBITS WITHOUT AND WITH  CONTRAST TECHNIQUE: Multiplanar, multiecho pulse sequences of the brain and surrounding structures were obtained without and with intravenous contrast. Multiplanar, multiecho pulse sequences of the orbits and surrounding structures were obtained including fat saturation techniques, before and after intravenous contrast administration. CONTRAST:  84mL GADAVIST GADOBUTROL 1 MMOL/ML IV SOLN COMPARISON:  None. FINDINGS: MRI HEAD FINDINGS Brain: There is no acute infarction or intracranial hemorrhage. Patchy and confluent areas of T2 hyperintensity in the periventricular greater than subcortical white matter are compatible with history of multiple sclerosis. There is some involvement of the brainstem. Ex vacuo dilatation of the lateral ventricles. There is thinning of the corpus callosum. No abnormal enhancement. No definite evidence of PML. No prior imaging available for comparison. Vascular: Major vessel flow voids at the skull base are preserved. Skull and upper cervical spine: Marrow signal is within normal limits. Other: Sella is unremarkable.  Mastoid air cells are clear. MRI ORBITS FINDINGS Orbits: There is no proptosis. No intraorbital mass. Globes, extraocular muscles, and lacrimal glands are symmetric and unremarkable. There is no abnormal enhancement of the optic nerve sheath complexes. Left optic nerve T2 hyperintensity is suspected, but optic nerves also appear subjectively small. Visualized sinuses: No significant opacification. Soft tissues: Unremarkable. IMPRESSION: No optic nerve enhancement. Left optic nerve T2 hyperintensity suspected  within limitation of motion artifact. The optic nerves do appear subjectively small and therefore, this could reflect edema or sequelae of prior inflammation (i.e. prior episodes of optic neuritis). Moderate white matter disease compatible with history of multiple sclerosis. No evidence of active demyelination. Electronically Signed   By: Guadlupe SpanishPraneil  Patel M.D.   On:  10/09/2020 20:58   ECHOCARDIOGRAM COMPLETE  Result Date: 10/10/2020    ECHOCARDIOGRAM REPORT   Patient Name:   Lynn Alvarez Date of Exam: 10/10/2020 Medical Rec #:  [id number]     Height:       64.0 in Accession #:    [id number]    Weight:       137.0 lb Date of Birth:  [birth year]    BSA:          1.666 m Patient Age:    49 years      BP:           150/88 mmHg Patient Gender: F             HR:           61 bpm. Exam Location:  [facility] Procedure: 2D Echo, Color Doppler and Cardiac Doppler Indications:     I63.9 Stroke  History:         Patient has no prior history of Echocardiogram examinations.                  Multiple sclerosis; Risk Factors:Hypertension.  Sonographer:     Humphrey RollsJoan Heiss RDCS (AE) Referring Phys:  [id number] Lucile ShuttersCHUKWU AGBATA Diagnosing Phys: Harold HedgeKenneth Fath MD  Sonographer Comments: Suboptimal parasternal window and suboptimal subcostal window. Image acquisition challenging due to respiratory motion. IMPRESSIONS  1. Left ventricular ejection fraction, by estimation, is 70 to 75%. The left ventricle has hyperdynamic function. The left ventricle has no regional wall motion abnormalities. There is moderate left ventricular hypertrophy. Left ventricular diastolic parameters are consistent with Grade I diastolic dysfunction (impaired relaxation).  2. Right ventricular systolic function is normal. The right ventricular size is normal.  3. The mitral valve is grossly normal. Trivial mitral valve regurgitation.  4. The aortic valve is grossly normal. Aortic valve regurgitation is trivial. FINDINGS  Left Ventricle: Left ventricular ejection fraction, by estimation, is 70 to 75%. The left ventricle has hyperdynamic function. The left ventricle has no regional wall motion abnormalities. The left ventricular internal cavity size was normal in size. There is moderate left ventricular hypertrophy. Left ventricular diastolic parameters are consistent with Grade I diastolic dysfunction (impaired relaxation). Right Ventricle:  The right ventricular size is normal. No increase in right ventricular wall thickness. Right ventricular systolic function is normal. Left Atrium: Left atrial size was normal in size. Right Atrium: Right atrial size was normal in size. Pericardium: There is no evidence of pericardial effusion. Mitral Valve: The mitral valve is grossly normal. Trivial mitral valve regurgitation. MV peak gradient, 2.9 mmHg. The mean mitral valve gradient is 1.0 mmHg. Tricuspid Valve: The tricuspid valve is grossly normal. Tricuspid valve regurgitation is trivial. Aortic Valve: The aortic valve is grossly normal. Aortic valve regurgitation is trivial. Aortic valve mean gradient measures 8.0 mmHg. Aortic valve peak gradient measures 15.8 mmHg. Aortic valve area, by VTI measures 1.66 cm. Pulmonic Valve: The pulmonic valve was not well visualized. Pulmonic valve regurgitation is trivial. Aorta: The aortic root is normal in size and structure. IAS/Shunts: The interatrial septum was not well visualized.  LEFT VENTRICLE PLAX 2D LVIDd:  3.60 cm  Diastology LVIDs:         2.00 cm  LV e' medial:    5.33 cm/s LV PW:         1.10 cm  LV E/e' medial:  11.0 LV IVS:        0.90 cm  LV e' lateral:   10.20 cm/s LVOT diam:     1.80 cm  LV E/e' lateral: 5.8 LV SV:         59 LV SV Index:   35 LVOT Area:     2.54 cm  RIGHT VENTRICLE RV Basal diam:  2.70 cm LEFT ATRIUM             Index       RIGHT ATRIUM          Index LA diam:        3.10 cm 1.86 cm/m  RA Area:     9.26 cm LA Vol (A2C):   38.0 ml 22.81 ml/m RA Volume:   15.50 ml 9.30 ml/m LA Vol (A4C):   35.5 ml 21.31 ml/m LA Biplane Vol: 38.7 ml 23.23 ml/m  AORTIC VALVE                    PULMONIC VALVE AV Area (Vmax):    1.79 cm     PV Vmax:       1.01 m/s AV Area (Vmean):   1.83 cm     PV Vmean:      77.000 cm/s AV Area (VTI):     1.66 cm     PV VTI:        0.215 m AV Vmax:           199.00 cm/s  PV Peak grad:  4.1 mmHg AV Vmean:          135.000 cm/s PV Mean grad:  3.0 mmHg AV VTI:             0.352 m AV Peak Grad:      15.8 mmHg AV Mean Grad:      8.0 mmHg LVOT Vmax:         140.00 cm/s LVOT Vmean:        96.900 cm/s LVOT VTI:          0.230 m LVOT/AV VTI ratio: 0.65  AORTA Ao Root diam: 2.90 cm MITRAL VALVE MV Area (PHT): 2.53 cm    SHUNTS MV Area VTI:   1.97 cm    Systemic VTI:  0.23 m MV Peak grad:  2.9 mmHg    Systemic Diam: 1.80 cm MV Mean grad:  1.0 mmHg MV Vmax:       0.85 m/s MV Vmean:      48.6 cm/s MV Decel Time: 300 msec MV E velocity: 58.70 cm/s MV A velocity: 88.30 cm/s MV E/A ratio:  0.66 Harold Hedge MD Electronically signed by Harold Hedge MD Signature Date/Time: 10/10/2020/1:02:53 PM    Final    MR ORBITS W WO CONTRAST  Result Date: 10/09/2020 CLINICAL DATA:  Multiple sclerosis, left eye blurry vision EXAM: MRI HEAD AND ORBITS WITHOUT AND WITH CONTRAST TECHNIQUE: Multiplanar, multiecho pulse sequences of the brain and surrounding structures were obtained without and with intravenous contrast. Multiplanar, multiecho pulse sequences of the orbits and surrounding structures were obtained including fat saturation techniques, before and after intravenous contrast administration. CONTRAST:  76mL GADAVIST GADOBUTROL 1 MMOL/ML IV SOLN COMPARISON:  None. FINDINGS: MRI HEAD FINDINGS Brain: There is no  acute infarction or intracranial hemorrhage. Patchy and confluent areas of T2 hyperintensity in the periventricular greater than subcortical white matter are compatible with history of multiple sclerosis. There is some involvement of the brainstem. Ex vacuo dilatation of the lateral ventricles. There is thinning of the corpus callosum. No abnormal enhancement. No definite evidence of PML. No prior imaging available for comparison. Vascular: Major vessel flow voids at the skull base are preserved. Skull and upper cervical spine: Marrow signal is within normal limits. Other: Sella is unremarkable.  Mastoid air cells are clear. MRI ORBITS FINDINGS Orbits: There is no proptosis. No  intraorbital mass. Globes, extraocular muscles, and lacrimal glands are symmetric and unremarkable. There is no abnormal enhancement of the optic nerve sheath complexes. Left optic nerve T2 hyperintensity is suspected, but optic nerves also appear subjectively small. Visualized sinuses: No significant opacification. Soft tissues: Unremarkable. IMPRESSION: No optic nerve enhancement. Left optic nerve T2 hyperintensity suspected within limitation of motion artifact. The optic nerves do appear subjectively small and therefore, this could reflect edema or sequelae of prior inflammation (i.e. prior episodes of optic neuritis). Moderate white matter disease compatible with history of multiple sclerosis. No evidence of active demyelination. Electronically Signed   By: Guadlupe Spanish M.D.   On: 10/09/2020 20:58    Microbiology: Recent Results (from the past 240 hour(s))  Resp Panel by RT-PCR (Flu A&B, Covid) Nasopharyngeal Swab     Status: None   Collection Time: 10/09/20  6:42 PM   Specimen: Nasopharyngeal Swab; Nasopharyngeal(NP) swabs in vial transport medium  Result Value Ref Range Status   SARS Coronavirus 2 by RT PCR NEGATIVE NEGATIVE Final    Comment: (NOTE) SARS-CoV-2 target nucleic acids are NOT DETECTED.  The SARS-CoV-2 RNA is generally detectable in upper respiratory specimens during the acute phase of infection. The lowest concentration of SARS-CoV-2 viral copies this assay can detect is 138 copies/mL. A negative result does not preclude SARS-Cov-2 infection and should not be used as the sole basis for treatment or other patient management decisions. A negative result may occur with  improper specimen collection/handling, submission of specimen other than nasopharyngeal swab, presence of viral mutation(s) within the areas targeted by this assay, and inadequate number of viral copies(<138 copies/mL). A negative result must be combined with clinical observations, patient history, and  epidemiological information. The expected result is Negative.  Fact Sheet for Patients:  BloggerCourse.com  Fact Sheet for Healthcare Providers:  SeriousBroker.it  This test is no t yet approved or cleared by the [place] FDA and  has been authorized for detection and/or diagnosis of SARS-CoV-2 by FDA under an Emergency Use Authorization (EUA). This EUA will remain  in effect (meaning this test can be used) for the duration of the COVID-19 declaration under Section 564(b)(1) of the Act, 21 U.S.C.section 360bbb-3(b)(1), unless the authorization is terminated  or revoked sooner.       Influenza A by PCR NEGATIVE NEGATIVE Final   Influenza B by PCR NEGATIVE NEGATIVE Final    Comment: (NOTE) The Xpert Xpress SARS-CoV-2/FLU/RSV plus assay is intended as an aid in the diagnosis of influenza from Nasopharyngeal swab specimens and should not be used as a sole basis for treatment. Nasal washings and aspirates are unacceptable for Xpert Xpress SARS-CoV-2/FLU/RSV testing.  Fact Sheet for Patients: BloggerCourse.com  Fact Sheet for Healthcare Providers: SeriousBroker.it  This test is not yet approved or cleared by the [place] FDA and has been authorized for detection and/or diagnosis of SARS-CoV-2 by FDA under an  Emergency Use Authorization (EUA). This EUA will remain in effect (meaning this test can be used) for the duration of the COVID-19 declaration under Section 564(b)(1) of the Act, 21 U.S.C. section 360bbb-3(b)(1), unless the authorization is terminated or revoked.  Performed at [facility], [address] [place], [place] [address]      Labs: Basic Metabolic Panel: Recent Labs  Lab 10/09/20 1630  NA 137  K 3.9  CL 104  CO2 21*  GLUCOSE 95  BUN 21*  CREATININE 1.14*  CALCIUM 9.7   Liver Function Tests: No results for input(s): AST, ALT,  ALKPHOS, BILITOT, PROT, ALBUMIN in the last 168 hours. No results for input(s): LIPASE, AMYLASE in the last 168 hours. No results for input(s): AMMONIA in the last 168 hours. CBC: Recent Labs  Lab 10/09/20 1630  WBC 5.1  HGB 12.9  HCT 39.2  MCV 83.6  PLT 217   Cardiac Enzymes: No results for input(s): CKTOTAL, CKMB, CKMBINDEX, TROPONINI in the last 168 hours. BNP: BNP (last 3 results) No results for input(s): BNP in the last 8760 hours.  ProBNP (last 3 results) No results for input(s): PROBNP in the last 8760 hours.  CBG: No results for input(s): GLUCAP in the last 168 hours.     Signed:  Hollice Espy, MD [facility] 10/12/2020, 10:39 AM

## 2021-03-05 ENCOUNTER — Emergency Department
Admission: EM | Admit: 2021-03-05 | Discharge: 2021-03-05 | Discharge status: Against Medical Advice | Payer: Federal, State, Local not specified - PPO

## 2021-05-11 DIAGNOSIS — I639 Cerebral infarction, unspecified: Secondary | ICD-10-CM

## 2021-05-11 HISTORY — DX: Cerebral infarction, unspecified: I63.9

## 2021-10-07 ENCOUNTER — Other Ambulatory Visit: Payer: Self-pay

## 2021-10-07 ENCOUNTER — Emergency Department: Payer: Federal, State, Local not specified - PPO

## 2021-10-07 ENCOUNTER — Emergency Department
Admission: EM | Admit: 2021-10-07 | Discharge: 2021-10-07 | Disposition: A | Discharge status: Home / Self Care | Payer: Federal, State, Local not specified - PPO | Attending: Emergency Medicine | Admitting: Emergency Medicine

## 2021-10-07 DIAGNOSIS — M542 Cervicalgia: Secondary | ICD-10-CM | POA: Insufficient documentation

## 2021-10-07 DIAGNOSIS — I10 Essential (primary) hypertension: Secondary | ICD-10-CM | POA: Insufficient documentation

## 2021-10-07 DIAGNOSIS — M546 Pain in thoracic spine: Secondary | ICD-10-CM | POA: Diagnosis not present

## 2021-10-07 DIAGNOSIS — Y92410 Unspecified street and highway as the place of occurrence of the external cause: Secondary | ICD-10-CM | POA: Diagnosis not present
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
## 2021-10-07 MED ORDER — NAPROXEN 500 MG PO TABS: 500.0000 mg | ORAL_TABLET | Freq: Two times a day (BID) | ORAL | 11 refills | Status: AC

## 2021-10-07 MED ORDER — CYCLOBENZAPRINE HCL 10 MG PO TABS: 10.0000 mg | ORAL_TABLET | Freq: Three times a day (TID) | ORAL | 0 refills | Status: AC | PRN

## 2021-10-07 MED ORDER — ACETAMINOPHEN 325 MG PO TABS: 650.0000 mg | ORAL_TABLET | ORAL | 2 refills | Status: AC | PRN
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
## 2021-10-07 NOTE — Discharge Instructions (Addendum)
-  You may take Tylenol and naproxen as needed for pain.  -Take the cyclobenzaprine as prescribed.  Use caution while taking this medication, as it may make you drowsy.  -Follow-up with your primary care provider as needed.  -Return to the emergency department anytime if you begin to experience any new or worsening symptoms
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
## 2023-01-15 ENCOUNTER — Other Ambulatory Visit: Payer: Self-pay

## 2023-01-15 ENCOUNTER — Emergency Department: Payer: Medicaid Other

## 2023-01-15 ENCOUNTER — Emergency Department
Admission: EM | Admit: 2023-01-15 | Discharge: 2023-01-15 | Disposition: A | Discharge status: Home / Self Care | Payer: Medicaid Other | Attending: Emergency Medicine | Admitting: Emergency Medicine

## 2023-01-15 DIAGNOSIS — R109 Unspecified abdominal pain: Secondary | ICD-10-CM

## 2023-01-15 DIAGNOSIS — I10 Essential (primary) hypertension: Secondary | ICD-10-CM | POA: Diagnosis not present

## 2023-01-15 DIAGNOSIS — R0781 Pleurodynia: Secondary | ICD-10-CM | POA: Insufficient documentation

## 2023-01-15 DIAGNOSIS — M25511 Pain in right shoulder: Secondary | ICD-10-CM | POA: Diagnosis present

## 2023-01-15 DIAGNOSIS — Y92410 Unspecified street and highway as the place of occurrence of the external cause: Secondary | ICD-10-CM | POA: Diagnosis not present

## 2023-01-15 MED ORDER — IBUPROFEN 600 MG PO TABS
600.0000 mg | ORAL_TABLET | Freq: Once | ORAL | Status: AC
  Administered 2023-01-15: 600 mg via ORAL
  Filled 2023-01-15: qty 1
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
## 2023-01-15 NOTE — Discharge Instructions (Addendum)
Please use ibuprofen (Motrin) up to 800 mg every 8 hours, naproxen (Naprosyn) up to 500 mg every 12 hours, and/or acetaminophen (Tylenol) up to 4 g/day for any continued pain.  Please do not use this medication regimen for longer than 7 days
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
## 2023-01-18 NOTE — Group Note (Deleted)

## 2023-05-13 ENCOUNTER — Other Ambulatory Visit: Payer: Self-pay

## 2023-05-13 ENCOUNTER — Emergency Department
Admission: EM | Admit: 2023-05-13 | Discharge: 2023-05-13 | Disposition: A | Discharge status: Home / Self Care | Payer: Medicaid Other | Attending: Emergency Medicine | Admitting: Emergency Medicine

## 2023-05-13 ENCOUNTER — Encounter: Payer: Self-pay | Admitting: Emergency Medicine

## 2023-05-13 DIAGNOSIS — Z20822 Contact with and (suspected) exposure to covid-19: Secondary | ICD-10-CM | POA: Diagnosis not present

## 2023-05-13 DIAGNOSIS — R197 Diarrhea, unspecified: Secondary | ICD-10-CM | POA: Insufficient documentation

## 2023-05-13 DIAGNOSIS — I10 Essential (primary) hypertension: Secondary | ICD-10-CM | POA: Diagnosis not present

## 2023-05-13 LAB — RESP PANEL BY RT-PCR (RSV, FLU A&B, COVID)  RVPGX2
Influenza A by PCR: NEGATIVE
Influenza B by PCR: NEGATIVE
Resp Syncytial Virus by PCR: NEGATIVE
SARS Coronavirus 2 by RT PCR: NEGATIVE

## 2023-05-13 NOTE — ED Provider Notes (Signed)
 [facility] Provider Note    Event Date/Time   First MD Initiated Contact with Patient 05/13/23 1251     (approximate)   History   Diarrhea   HPI  Lynn Alvarez is a 53 y.o. female who presents today with request for COVID test.  Patient reports that yesterday she had 1 episode of loose stools and her friend told her that that could be the first sign of COVID so she wants to get a COVID test today.  She reports that her diarrhea has resolved.  She has not had any fevers or chills.  No cough or nasal congestion.  No abdominal pain, chest pain, shortness of breath.  No black or bloody stools.  No recent antibiotics.  No recent travel.  Patient Active Problem List   Diagnosis Date Noted   Malnutrition of moderate degree 10/11/2020   Bradycardia 10/10/2020   Visual impairment 10/09/2020   Hypertension    Multiple sclerosis (HCC)           Physical Exam   Triage Vital Signs: ED Triage Vitals  Encounter Vitals Group     BP 05/13/23 1118 (!) 151/102     Systolic BP Percentile --      Diastolic BP Percentile --      Pulse Rate 05/13/23 1118 64     Resp 05/13/23 1118 18     Temp 05/13/23 1118 99.1 F (37.3 C)     Temp Source 05/13/23 1118 Oral     SpO2 05/13/23 1118 100 %     Weight 05/13/23 1117 145 lb (65.8 kg)     Height 05/13/23 1117 5' 4 (1.626 m)     Head Circumference --      Peak Flow --      Pain Score 05/13/23 1117 0     Pain Loc --      Pain Education --      Exclude from Growth Chart --     Most recent vital signs: Vitals:   05/13/23 1118  BP: (!) 151/102  Pulse: 64  Resp: 18  Temp: 99.1 F (37.3 C)  SpO2: 100%    Physical Exam Vitals and nursing note reviewed.  Constitutional:      General: Awake and alert. No acute distress.    Appearance: Normal appearance. The patient is normal weight.  HENT:     Head: Normocephalic and atraumatic.     Mouth: Mucous membranes are moist.  Eyes:     General: PERRL. Normal EOMs         Right eye: No discharge.        Left eye: No discharge.     Conjunctiva/sclera: Conjunctivae normal.  Cardiovascular:     Rate and Rhythm: Normal rate and regular rhythm.     Pulses: Normal pulses.  Pulmonary:     Effort: Pulmonary effort is normal. No respiratory distress.     Breath sounds: Normal breath sounds.  Abdominal:     Abdomen is soft. There is no abdominal tenderness. No rebound or guarding. No distention. Musculoskeletal:        General: No swelling. Normal range of motion.     Cervical back: Normal range of motion and neck supple.  Skin:    General: Skin is warm and dry.     Capillary Refill: Capillary refill takes less than 2 seconds.     Findings: No rash.  Neurological:     Mental Status: The patient is awake and alert.  ED Results / Procedures / Treatments   Labs (all labs ordered are listed, but only abnormal results are displayed) Labs Reviewed  RESP PANEL BY RT-PCR (RSV, FLU A&B, COVID)  [id number]     EKG     RADIOLOGY     PROCEDURES:  Critical Care performed:   Procedures   MEDICATIONS ORDERED IN ED: Medications - No data to display   IMPRESSION / MDM / ASSESSMENT AND PLAN / ED COURSE  I reviewed the triage vital signs and the nursing notes.   Differential diagnosis includes, but is not limited to, COVID-19, gastroenteritis, dehydration, electrolyte disarray.  Patient is awake and alert, hemodynamically stable and afebrile.  She is nontoxic in appearance.  Her abdomen is soft and nontender throughout.  She is hemodynamically stable.  No recent antibiotic use to suggest C. difficile.  She is tolerating p.o. without difficulty.  She denies having had any abdominal pain, bloody stool, or fever.  COVID/flu/RSV swab obtained in triage is negative.  Patient declines any further workup, reported that she just wanted to check for COVID only.  She declines blood work/imaging.  We discussed return precautions and outpatient follow-up.   Patient understands and agrees with plan.  She was discharged in stable condition.  Patient's presentation is most consistent with acute complicated illness / injury requiring diagnostic workup.     FINAL CLINICAL IMPRESSION(S) / ED DIAGNOSES   Final diagnoses:  Diarrhea, unspecified type     Rx / DC Orders   ED Discharge Orders     None        Note:  This document was prepared using Dragon voice recognition software and may include unintentional dictation errors.   Lynn Sissel E, PA-C 05/13/23 1738    Suzanne Kirsch, MD 05/14/23 [phone]

## 2023-05-13 NOTE — ED Triage Notes (Signed)
 Patient to ED via POV for covid swab. PT states she had an episode of diarrhea and has been around covid. No other symptoms.

## 2023-05-13 NOTE — Discharge Instructions (Signed)
 Patient her negative abdominal pain, bloody stool, fevers, vomiting, or any other concerns.  Your COVID/flu/RSV test was negative.  Please remember to eat a bland diet and stay hydrated.  It was a pleasure caring for you today.

## 2023-11-01 ENCOUNTER — Emergency Department

## 2023-11-01 ENCOUNTER — Other Ambulatory Visit: Payer: Self-pay

## 2023-11-01 ENCOUNTER — Emergency Department
Admission: EM | Admit: 2023-11-01 | Discharge: 2023-11-01 | Disposition: A | Discharge status: Home / Self Care | Attending: Emergency Medicine | Admitting: Emergency Medicine

## 2023-11-01 DIAGNOSIS — W19XXXA Unspecified fall, initial encounter: Secondary | ICD-10-CM | POA: Insufficient documentation

## 2023-11-01 DIAGNOSIS — M25559 Pain in unspecified hip: Secondary | ICD-10-CM | POA: Diagnosis present

## 2023-11-01 DIAGNOSIS — I10 Essential (primary) hypertension: Secondary | ICD-10-CM | POA: Diagnosis not present

## 2023-11-01 DIAGNOSIS — M533 Sacrococcygeal disorders, not elsewhere classified: Secondary | ICD-10-CM | POA: Insufficient documentation

## 2023-11-01 NOTE — ED Triage Notes (Signed)
 Pt sts that she fell twice. Pt sts that she is having buttocks pain. Pt sts that she has not hit her head at all on both of the falls.

## 2023-11-01 NOTE — ED Provider Notes (Signed)
 [facility] Provider Note    Event Date/Time   First MD Initiated Contact with Patient 11/01/23 1627     (approximate)   History   Fall   HPI  Lynn Alvarez is a 53 y.o. female presenting to the emergency department with buttocks pain that has been present for 1 week.  Patient states she had a fall on June 13th outside onto her buttocks and this area has been hurting since then.  She had a prior fall onto the same area on May 21.  Denies loss of consciousness, hitting her head, headache, any other concerns, dysuria, hematuria, saddle anesthesia, bowel or bladder incontinence, fever, constipation, diarrhea.  Pertinent past medical history includes multiple sclerosis, hypertension, bradycardia.  Pain 9 out of 10 in triage.  Allergies include lisinopril.   Physical Exam   Triage Vital Signs: ED Triage Vitals [11/01/23 1412]  Encounter Vitals Group     BP (!) 131/110     Girls Systolic BP Percentile      Girls Diastolic BP Percentile      Boys Systolic BP Percentile      Boys Diastolic BP Percentile      Pulse Rate 63     Resp 17     Temp 98.4 F (36.9 C)     Temp Source Oral     SpO2 99 %     Weight 150 lb (68 kg)     Height 5' 4 (1.626 m)     Head Circumference      Peak Flow      Pain Score 9     Pain Loc      Pain Education      Exclude from Growth Chart     Most recent vital signs: Vitals:   11/01/23 1412 11/01/23 1752  BP: (!) 131/110 (!) 166/78  Pulse: 63 64  Resp: 17 17  Temp: 98.4 F (36.9 C)   SpO2: 99% 99%    General: Awake, no distress.  CV:  Good peripheral perfusion.  Resp:  Normal effort.  Abd:  No distention.  Other:  Tender to palpation in the sacral/coccyx region.  Able to perform lumbar flexion, extension, rotation with ease.  No ecchymosis, rash, lesions, abrasions to the affected area.  No obvious deformities or swelling.    ED Results / Procedures / Treatments   Labs (all labs ordered are listed, but only  abnormal results are displayed) Labs Reviewed - No data to display   EKG    RADIOLOGY X-ray of sacrum and coccyx ordered.   FINDINGS: There is no evidence of fracture or other focal bone lesions.   IMPRESSION: Negative  Imaging was independently viewed and interpreted by me as well as the radiologist. I agree with the radiologist's report that there are no acute fractures or focal lesions.   PROCEDURES:  Critical Care performed: No  Procedures   MEDICATIONS ORDERED IN ED: Medications - No data to display   IMPRESSION / MDM / ASSESSMENT AND PLAN / ED COURSE  I reviewed the triage vital signs and the nursing notes.                              Differential diagnosis includes, but is not limited to, mechanical fall, contusion, sacral fracture, coccyx fracture, cauda equina syndrome  Patient's presentation is most consistent with acute complicated illness / injury requiring diagnostic workup.  Patient is a 53 year old female  who presented today with buttocks pain following 2 falls that were sustained 1 week ago and approximately 1 month ago.  X-ray of the sacral and coccyx area was ordered, there are no acute findings.  I discussed with her how she may have some internal bruising that is causing her pain following the fall and how this will take some time to heal.  I gave her instructions on fall prevention in her home.  She can use Tylenol  and/or ibuprofen  as needed for pain.  We also discussed purchasing a doughnut pillow to help with pain.  131/110 bp in triage, repeated prior to discharge was 166/78.  Patient states she did not take her blood pressure medication until she got to the emergency department.  All other vital signs within normal range.  Patient was given opportunity to ask questions; all questions were answered.  She will follow-up with her primary care provider if this is not resolving over the next few weeks or in the emergency department for any new or  concerning symptoms.  Emergency department return precautions were discussed with the patient.  Patient is in agreement to the treatment plan.  Patient is stable for discharge.     FINAL CLINICAL IMPRESSION(S) / ED DIAGNOSES   Final diagnoses:  Fall, initial encounter  Pain in the coccyx     Rx / DC Orders   ED Discharge Orders     None        Note:  This document was prepared using Dragon voice recognition software and may include unintentional dictation errors.    [address] [place], PA-C 11/01/23 1753    Suzanne Kirsch, MD 11/01/23 2352

## 2023-11-01 NOTE — Discharge Instructions (Addendum)
 You were seen in the emergency department for pain in your sacral and coccyx region following a fall.  I have provided you with information for fall prevention in your home.  You can take Tylenol  and ibuprofen  based on the written instructions on the box as needed for pain.  You should also consider purchasing a doughnut pillow to help with any pain you have.  Please follow-up with your primary care provider or in the emergency department for any new, worsening, or concerning symptoms.

## 2024-02-24 ENCOUNTER — Ambulatory Visit: Admitting: Physical Therapy

## 2024-02-24 ENCOUNTER — Ambulatory Visit: Attending: Neurology | Admitting: Physical Therapy

## 2024-02-24 ENCOUNTER — Other Ambulatory Visit: Payer: Self-pay

## 2024-02-24 ENCOUNTER — Encounter: Payer: Self-pay | Admitting: Physical Therapy

## 2024-02-24 DIAGNOSIS — R269 Unspecified abnormalities of gait and mobility: Secondary | ICD-10-CM | POA: Diagnosis present

## 2024-02-24 DIAGNOSIS — R262 Difficulty in walking, not elsewhere classified: Secondary | ICD-10-CM | POA: Diagnosis present

## 2024-02-24 DIAGNOSIS — M6289 Other specified disorders of muscle: Secondary | ICD-10-CM

## 2024-02-24 DIAGNOSIS — R278 Other lack of coordination: Secondary | ICD-10-CM | POA: Insufficient documentation

## 2024-02-24 NOTE — Therapy (Signed)
 OUTPATIENT PHYSICAL THERAPY NEURO EVALUATION   Patient Name: Lynn Alvarez MRN: [id number] DOB:[birth year], 53 y.o., female Today's Date: 02/24/2024   PCP: Amil Sovereign, MD  REFERRING PROVIDER: Devera Bruckner, MD   END OF SESSION:  PT End of Session - 02/24/24 0943     Visit Number 1    Number of Visits 25    Date for Recertification  05/18/24    PT Start Time 0941    PT Stop Time 1015    PT Time Calculation (min) 34 min    Equipment Utilized During Treatment Gait belt    Activity Tolerance Patient tolerated treatment well    Behavior During Therapy [facility] for tasks assessed/performed          Past Medical History:  Diagnosis Date   Hypertension    Multiple sclerosis    History reviewed. No pertinent surgical history. Patient Active Problem List   Diagnosis Date Noted   Malnutrition of moderate degree 10/11/2020   Bradycardia 10/10/2020   Visual impairment 10/09/2020   Hypertension    Multiple sclerosis     ONSET DATE: March 2023  REFERRING DIAG:  G35.D (ICD-10-CM) - MS (multiple sclerosis)  Z74.09 (ICD-10-CM) - Impaired functional mobility, balance, gait, and endurance  R53.1 (ICD-10-CM) - Generalized weakness    THERAPY DIAG:  Abnormality of gait and mobility  Other lack of coordination  Difficulty in walking, not elsewhere classified  Abnormal increased muscle tone  Rationale for Evaluation and Treatment: Rehabilitation  SUBJECTIVE:                                                                                                                                                                                             SUBJECTIVE STATEMENT:  Pt arrived late to scheduled PT Evaluation, slightly limiting assessment. Per pt,  Diagnosed with relapsing remitting  MS in 2005. Was able to keep working until 2023. Reports that she had some muscle stiffness that caused her to seek medical care.  In 2023 pt reports CVA. Report that her mobility  has gotten much worse. States constant pain BLE but worse in the RLE. Reports that BLE feel weak and heavy, but the R is worse than the L. Pt transitioning care to [facility] for improved ease of access to PT services.   Pt accompanied by: self  PERTINENT HISTORY:   From recent MD visit- 53 y.o. female presenting to clinic for follow-up regarding MS, with plans to initiate Briumvi for DMT. Since the last visit with me on 11/30/23, she has been experiencing significant fatigue and pain over the past few weeks. She unfortunately has not been able to  initiate Briumvi yet, nor has she started the Cymbalta recommended at our last visit.    PAIN:  Are you having pain? Yes: NPRS scale: 0 Pain location: BLE  Pain description: soreness and occasional stabbing  Aggravating factors: sitting too long  Relieving factors: rest.  PRECAUTIONS: Fall  RED FLAGS: None   WEIGHT BEARING RESTRICTIONS: No  FALLS: Has patient fallen in last 6 months? No and a few near falls   LIVING ENVIRONMENT: Lives with: lives with their spouse Lives in: House/apartment Stairs: No Has following equipment at home: Single point cane and Environmental consultant - 4 wheeled  PLOF: Independent with household mobility with device, Requires assistive device for independence, Needs assistance with ADLs, and uses motorized cart for access to stores   PATIENT GOALS:  Improve strength in legs Get back to walk   OBJECTIVE:  Note: Objective measures were completed at Evaluation unless otherwise noted.  DIAGNOSTIC FINDINGS:  CT 2023 thoracic IMPRESSION: 1. No acute displaced fracture or traumatic listhesis of the cervical spine. 2. No acute displaced fracture or traumatic listhesis of the thoracic spine. Cervical  IMPRESSION: 1. No acute displaced fracture or traumatic listhesis of the cervical spine. 2. No acute displaced fracture or traumatic listhesis of the thoracic spine  COGNITION: Overall cognitive status: Within  functional limits for tasks assessed   SENSATION: WFL  COORDINATION: Severely ridged/tonic in BLE   EDEMA:    MUSCLE TONE: RLE: Moderate, Severe, Rigidity, and Hypertonic  MUSCLE LENGTH: Need to assess.     POSTURE: rounded shoulders, forward head, decreased lumbar lordosis, increased thoracic kyphosis, left pelvic obliquity, and flexed trunk   LOWER EXTREMITY ROM:     Active  Right Eval Left Eval  Hip flexion Lacking 15 deg Lacking 10 deg   Hip extension    Hip abduction 0-5deg beyond neutral  5-10 deg beyond neutral  Hip adduction    Hip internal rotation    Hip external rotation    Knee flexion    Knee extension    Ankle dorsiflexion 5 DF 10 DF  Ankle plantarflexion    Ankle inversion    Ankle eversion     *was able to increase ROM with prolonged passive stretching    (Blank rows = not tested)  LOWER EXTREMITY MMT:    MMT Right Eval Left Eval  Hip flexion 4 4  Hip extension    Hip abduction 4 4  Hip adduction 4+ 4+  Hip internal rotation    Hip external rotation    Knee flexion 4+ 4+  Knee extension 4- 4  Ankle dorsiflexion 4 4+  Ankle plantarflexion    Ankle inversion    Ankle eversion    Strength assessed in available range.   (Blank rows = not tested)  BED MOBILITY:  Not tested  TRANSFERS: Sit to stand: SBA and CGA  Assistive device utilized: Walker - 4 wheeled and arm rest     Stand to sit: SBA and CGA  Assistive device utilized: Environmental consultant - 4 wheeled and arm rest     Chair to chair: SBA and CGA  Assistive device utilized: Environmental consultant - 4 wheeled       RAMP:  Not tested  CURB:  Not tested  STAIRS: Not tested GAIT: Findings: Gait Characteristics: step to pattern, decreased stride length, circumduction- Right, Right hip hike, Right steppage, scissoring, narrow BOS, poor foot clearance- Right, and poor foot clearance- Left, Distance walked: 20, Assistive device utilized:Walker - 4 wheeled, Level of assistance: CGA and Min  A, and Comments:     FUNCTIONAL TESTS:  5 times sit to stand: 47 sec Timed up and go (TUG): 1:53 min  PATIENT SURVEYS:  ABC scale: The Activities-Specific Balance Confidence (ABC) Scale 0% 10 20 30  40 50 60 70 80 90 100% No confidence<->completely confident  "How confident are you that you will not lose your balance or become unsteady when you . . .   Date tested   Walk around the house   2. Walk up or down stairs   3. Bend over and pick up a slipper from in front of a closet floor   4. Reach for a small can off a shelf at eye level   5. Stand on tip toes and reach for something above your head   6. Stand on a chair and reach for something   7. Sweep the floor   8. Walk outside the house to a car parked in the driveway   9. Get into or out of a car   10. Walk across a parking lot to the mall   11. Walk up or down a ramp   12. Walk in a crowded mall where people rapidly walk past you   13. Are bumped into by people as you walk through the mall   14. Step onto or off of an escalator while you are holding onto the railing   15. Step onto or off an escalator while holding onto parcels such that you cannot hold onto the railing   16. Walk outside on icy sidewalks   Total: #/16                                                                                                                                  TREATMENT DATE: 02/24/24  PT evaluation     PATIENT EDUCATION: Education details: POC. Goals.  Person educated: Patient Education method: Explanation Education comprehension: verbalized understanding  HOME EXERCISE PROGRAM: To be initiated on following session   GOALS: Goals reviewed with patient? Yes   SHORT TERM GOALS: Target date: 03/24/2024    Patient will be independent in home exercise program to improve strength/mobility for better functional independence with ADLs. Baseline: to be initiated  Goal status: INITIAL   LONG TERM GOALS: Target date: 05/25/2024    Patient will  increase ABC scale score >80% to demonstrate better functional mobility and better confidence with ADLs.  Baseline: to be completed Goal status: INITIAL  2.  Patient (> 1 years old) will improve 5x STS by > 10 seconds indicating an increased LE strength and improved balance. Baseline: 47sec  Goal status: INITIAL  3.  Patient will increase Berg Balance score by > 6 points to demonstrate decreased fall risk during functional activities Baseline: to be completed  Goal status: INITIAL  4.  Patient will increase 10 meter walk test by 0.93m/s as to improve gait speed for better community ambulation and  to reduce fall risk. Baseline: to be completed  Goal status: INITIAL  5.  Patient will reduce timed Up and go by 15 seconds  seconds to reduce fall risk and demonstrate improved transfer/gait ability. Baseline: 1:51min Goal status: INITIAL    ASSESSMENT:  CLINICAL IMPRESSION: Patient is a 53 y.o. female who was seen today for physical therapy evaluation and treatment for balance impairment secondary to MS and CVA. Pt presents with severe rigidity and tone In BLE R>L. Severe balance impairment noted with Tug using 4WW 1:53 min and 5x STS 47 sec with heavy UE support. Pt will benefit from skilled PT to address strength and balance deficits And improved overall QoL  OBJECTIVE IMPAIRMENTS: Abnormal gait, cardiopulmonary status limiting activity, decreased activity tolerance, decreased balance, decreased coordination, decreased endurance, decreased knowledge of condition, decreased knowledge of use of DME, decreased mobility, difficulty walking, decreased ROM, decreased strength, decreased safety awareness, hypomobility, increased fascial restrictions, impaired perceived functional ability, increased muscle spasms, impaired flexibility, impaired sensation, impaired tone, impaired UE functional use, improper body mechanics, and postural dysfunction.   ACTIVITY LIMITATIONS: carrying, lifting, bending,  standing, squatting, stairs, transfers, bed mobility, self feeding, hygiene/grooming, and locomotion level  PARTICIPATION LIMITATIONS: meal prep, cleaning, laundry, driving, shopping, and community activity  PERSONAL FACTORS: Age, Behavior pattern, Past/current experiences, Profession, Social background, Time since onset of injury/illness/exacerbation, and 3+ comorbidities:   are also affecting patient's functional outcome.   REHAB POTENTIAL: Good  CLINICAL DECISION MAKING: Unstable/unpredictable  EVALUATION COMPLEXITY: High  PLAN:  PT FREQUENCY: 1-2x/week  PT DURATION: 12 weeks  PLANNED INTERVENTIONS: 97164- PT Re-evaluation, 97750- Physical Performance Testing, 97110-Therapeutic exercises, 97530- Therapeutic activity, 97112- Neuromuscular re-education, 97535- Self Care, 02859- Manual therapy, [phone]- Gait training, [phone]- Aquatic Therapy, [phone]- Electrical stimulation (unattended), [phone]- Electrical stimulation (manual), Patient/Family education, Balance training, Stair training, Taping, Vestibular training, Visual/preceptual remediation/compensation, DME instructions, Wheelchair mobility training, Cryotherapy, and Moist heat  PLAN FOR NEXT SESSION:   BERG Initiate HEP ABC if time allows    Massie FORBES Dollar, PT 02/24/2024, 1:10 PM

## 2024-02-29 ENCOUNTER — Ambulatory Visit: Admitting: Physical Therapy

## 2024-02-29 DIAGNOSIS — M6289 Other specified disorders of muscle: Secondary | ICD-10-CM

## 2024-02-29 DIAGNOSIS — R269 Unspecified abnormalities of gait and mobility: Secondary | ICD-10-CM | POA: Diagnosis not present

## 2024-02-29 DIAGNOSIS — R278 Other lack of coordination: Secondary | ICD-10-CM

## 2024-02-29 DIAGNOSIS — R262 Difficulty in walking, not elsewhere classified: Secondary | ICD-10-CM

## 2024-02-29 NOTE — Therapy (Signed)
 OUTPATIENT PHYSICAL THERAPY NEURO Treatment    Patient Name: Lynn Alvarez MRN: [id number] DOB:Jul 01, 1970, 53 y.o., female Today's Date: 02/29/2024   PCP: Amil Sovereign, MD  REFERRING PROVIDER: Devera Bruckner, MD   END OF SESSION:  PT End of Session - 02/29/24 0926     Visit Number 2    Number of Visits 25    Date for Recertification  05/18/24    PT Start Time 0926    PT Stop Time 1006    PT Time Calculation (min) 40 min    Equipment Utilized During Treatment Gait belt    Activity Tolerance Patient tolerated treatment well    Behavior During Therapy [facility] for tasks assessed/performed          Past Medical History:  Diagnosis Date   Hypertension    Multiple sclerosis    No past surgical history on file. Patient Active Problem List   Diagnosis Date Noted   Malnutrition of moderate degree 10/11/2020   Bradycardia 10/10/2020   Visual impairment 10/09/2020   Hypertension    Multiple sclerosis     ONSET DATE: March 2023  REFERRING DIAG:  G35.D (ICD-10-CM) - MS (multiple sclerosis)  Z74.09 (ICD-10-CM) - Impaired functional mobility, balance, gait, and endurance  R53.1 (ICD-10-CM) - Generalized weakness    THERAPY DIAG:  Abnormality of gait and mobility  Other lack of coordination  Difficulty in walking, not elsewhere classified  Abnormal increased muscle tone  Rationale for Evaluation and Treatment: Rehabilitation  SUBJECTIVE:                                                                                                                                                                                             SUBJECTIVE STATEMENT:  Pt arrived late to scheduled PT Treatment, but able to be seen by author. States that she feels tight today, but glad to have transitioned PT treatment to [facility]    From Eval:  Per pt,  Diagnosed with relapsing remitting  MS in 2005. Was able to keep working until 2023. Reports that she had some muscle  stiffness that caused her to seek medical care.  In 2023 pt reports CVA. Report that her mobility has gotten much worse. States constant pain BLE but worse in the RLE. Reports that BLE feel weak and heavy, but the R is worse than the L. Pt transitioning care to [facility] for improved ease of access to PT services.   Pt accompanied by: self  PERTINENT HISTORY:   From recent MD visit- 53 y.o. female presenting to clinic for follow-up regarding MS, with plans to initiate Briumvi for DMT. Since the  last visit with me on 11/30/23, she has been experiencing significant fatigue and pain over the past few weeks. She unfortunately has not been able to initiate Briumvi yet, nor has she started the Cymbalta recommended at our last visit.    PAIN:  Are you having pain? Yes: NPRS scale: 0 Pain location: BLE  Pain description: soreness and occasional stabbing  Aggravating factors: sitting too long  Relieving factors: rest.  PRECAUTIONS: Fall  RED FLAGS: None   WEIGHT BEARING RESTRICTIONS: No  FALLS: Has patient fallen in last 6 months? No and a few near falls   LIVING ENVIRONMENT: Lives with: lives with their spouse Lives in: House/apartment Stairs: No Has following equipment at home: Single point cane and Environmental consultant - 4 wheeled  PLOF: Independent with household mobility with device, Requires assistive device for independence, Needs assistance with ADLs, and uses motorized cart for access to stores   PATIENT GOALS:  Improve strength in legs Get back to walk   OBJECTIVE:  Note: Objective measures were completed at Evaluation unless otherwise noted.  DIAGNOSTIC FINDINGS:  CT 2023 thoracic IMPRESSION: 1. No acute displaced fracture or traumatic listhesis of the cervical spine. 2. No acute displaced fracture or traumatic listhesis of the thoracic spine. Cervical  IMPRESSION: 1. No acute displaced fracture or traumatic listhesis of the cervical spine. 2. No acute displaced fracture or  traumatic listhesis of the thoracic spine  COGNITION: Overall cognitive status: Within functional limits for tasks assessed   SENSATION: WFL  COORDINATION: Severely ridged/tonic in BLE   EDEMA:    MUSCLE TONE: RLE: Moderate, Severe, Rigidity, and Hypertonic  MUSCLE LENGTH: Need to assess.     POSTURE: rounded shoulders, forward head, decreased lumbar lordosis, increased thoracic kyphosis, left pelvic obliquity, and flexed trunk   LOWER EXTREMITY ROM:     Active  Right Eval Left Eval  Hip flexion Lacking 15 deg Lacking 10 deg   Hip extension    Hip abduction 0-5deg beyond neutral  5-10 deg beyond neutral  Hip adduction    Hip internal rotation    Hip external rotation    Knee flexion    Knee extension    Ankle dorsiflexion 5 DF 10 DF  Ankle plantarflexion    Ankle inversion    Ankle eversion     *was able to increase ROM with prolonged passive stretching    (Blank rows = not tested)  LOWER EXTREMITY MMT:    MMT Right Eval Left Eval  Hip flexion 4 4  Hip extension    Hip abduction 4 4  Hip adduction 4+ 4+  Hip internal rotation    Hip external rotation    Knee flexion 4+ 4+  Knee extension 4- 4  Ankle dorsiflexion 4 4+  Ankle plantarflexion    Ankle inversion    Ankle eversion    Strength assessed in available range.   (Blank rows = not tested)  BED MOBILITY:  Not tested  TRANSFERS: Sit to stand: SBA and CGA  Assistive device utilized: Walker - 4 wheeled and arm rest     Stand to sit: SBA and CGA  Assistive device utilized: Environmental consultant - 4 wheeled and arm rest     Chair to chair: SBA and CGA  Assistive device utilized: Environmental consultant - 4 wheeled       RAMP:  Not tested  CURB:  Not tested  STAIRS: Not tested GAIT: Findings: Gait Characteristics: step to pattern, decreased stride length, circumduction- Right, Right hip hike, Right steppage, scissoring,  narrow BOS, poor foot clearance- Right, and poor foot clearance- Left, Distance walked: 20,  Assistive device utilized:Walker - 4 wheeled, Level of assistance: CGA and Min A, and Comments:    FUNCTIONAL TESTS:  5 times sit to stand: 47 sec Timed up and go (TUG): 1:53 min  PATIENT SURVEYS:  ABC scale: The Activities-Specific Balance Confidence (ABC) Scale 0% 10 20 30  40 50 60 70 80 90 100% No confidence<->completely confident  "How confident are you that you will not lose your balance or become unsteady when you . . .   Date tested   Walk around the house   2. Walk up or down stairs   3. Bend over and pick up a slipper from in front of a closet floor   4. Reach for a small can off a shelf at eye level   5. Stand on tip toes and reach for something above your head   6. Stand on a chair and reach for something   7. Sweep the floor   8. Walk outside the house to a car parked in the driveway   9. Get into or out of a car   10. Walk across a parking lot to the mall   11. Walk up or down a ramp   12. Walk in a crowded mall where people rapidly walk past you   13. Are bumped into by people as you walk through the mall   14. Step onto or off of an escalator while you are holding onto the railing   15. Step onto or off an escalator while holding onto parcels such that you cannot hold onto the railing   16. Walk outside on icy sidewalks   Total: #/16  62%                                                                                                                                 TREATMENT DATE: 02/24/24  HS stretch x 2 min bil  Hip adductor stretch in seated clam shell position x 1.5 min  Sit<>stand throughout session with moderate cues improved use of RLE to push into standing and overcome flexor tone in RLE.   10 Meter Walk Test: Patient instructed to walk 10 meters (32.8 ft) as quickly and as safely as possible at their normal speed x2 and at a fast speed x2. Time measured from 2 meter mark to 8 meter mark to accommodate ramp-up and ramp-down.  Normal speed 1: 47.3 sec  with 4WW Normal speed 2: 39.7sec with 4WW Average Normal speed: 43.5sec  0.82m/s   Cut off scores: <0.4 m/s = household Ambulator, 0.4-0.8 m/s = limited community Ambulator, >0.8 m/s = community Ambulator, >1.2 m/s = crossing a street, <1.0 = increased fall risk MCID 0.05 m/s (small), 0.13 m/s (moderate), 0.06 m/s (significant)  (ANPTA Core Set of Outcome Measures for Adults with Neurologic Conditions, 2018)  Gait training through rehab gym  with 4WW 2x 52ft to and from restroom for toilet needs. Was able to transfer without assist and perform hygiene once in restroom. Standing in slightly flexed posture. Narrow BOS for gait with decreased step length/height on BLE R>L.   PT instructed pt in HEP as listed below   PATIENT EDUCATION: Education details: POC. Goals.  Person educated: Patient Education method: Explanation Education comprehension: verbalized understanding  HOME EXERCISE PROGRAM: Access Code: [email or web address] URL: [email or web address] Date: 02/29/2024 Prepared by: Massie Dollar  Exercises - Sit to Stand with Armchair  - 1 x daily - 7 x weekly - 3 sets - 6 reps - Seated Long Arc Quad  - 1 x daily - 7 x weekly - 3 sets - 10 reps - Seated March  - 1 x daily - 7 x weekly - 3 sets - 10 reps - Seated Hamstring Stretch  - 1 x daily - 7 x weekly - 2 sets - 4 reps - 20 sec  hold - Supine 90/90 Hamstring Stretch with Caregiver  - 1 x daily - 7 x weekly - 1 sets - 3 reps - 30 sec  hold - Bent Knee Fallouts  - 1 x daily - 7 x weekly - 3 sets - 10 reps - Hip Abduction and Adduction Caregiver PROM  - 1 x daily - 7 x weekly - 1 sets - 3 reps - 30 sec  hold  GOALS: Goals reviewed with patient? Yes   SHORT TERM GOALS: Target date: 03/24/2024    Patient will be independent in home exercise program to improve strength/mobility for better functional independence with ADLs. Baseline: to be initiated  Goal status: INITIAL   LONG TERM GOALS: Target date:  05/25/2024    Patient will increase ABC scale score >80% to demonstrate better functional mobility and better confidence with ADLs.  Baseline:62% Goal status: INITIAL  2.  Patient (> 30 years old) will improve 5x STS by > 10 seconds indicating an increased LE strength and improved balance. Baseline: 47sec  Goal status: INITIAL  3.  Patient will increase Berg Balance score by > 6 points to demonstrate decreased fall risk during functional activities Baseline: to be completed  Goal status: INITIAL  4.  Patient will increase 10 meter walk test by 0.46m/s as to improve gait speed for better community ambulation and to reduce fall risk. Baseline: to be completed  Goal status: INITIAL  5.  Patient will reduce timed Up and go by 15 seconds  seconds to reduce fall risk and demonstrate improved transfer/gait ability. Baseline: 1:67min Goal status: INITIAL    ASSESSMENT:  CLINICAL IMPRESSION: Patient is a 53 y.o. female who was seen today for physical therapy  treatment for balance impairment secondary to MS and CVA. Completed limited to 0.17m/s. was noted to have improved posture following stretching of HS and hip adductors. HEP provided for improved BLE strength and movement patterns to allow terminal knee extension on the RLE.   Pt will benefit from skilled PT to address strength and balance deficits And improved overall QoL  OBJECTIVE IMPAIRMENTS: Abnormal gait, cardiopulmonary status limiting activity, decreased activity tolerance, decreased balance, decreased coordination, decreased endurance, decreased knowledge of condition, decreased knowledge of use of DME, decreased mobility, difficulty walking, decreased ROM, decreased strength, decreased safety awareness, hypomobility, increased fascial restrictions, impaired perceived functional ability, increased muscle spasms, impaired flexibility, impaired sensation, impaired tone, impaired UE functional use, improper body mechanics, and  postural dysfunction.   ACTIVITY LIMITATIONS: carrying, lifting, bending, standing, squatting, stairs,  transfers, bed mobility, self feeding, hygiene/grooming, and locomotion level  PARTICIPATION LIMITATIONS: meal prep, cleaning, laundry, driving, shopping, and community activity  PERSONAL FACTORS: Age, Behavior pattern, Past/current experiences, Profession, Social background, Time since onset of injury/illness/exacerbation, and 3+ comorbidities:   are also affecting patient's functional outcome.   REHAB POTENTIAL: Good  CLINICAL DECISION MAKING: Unstable/unpredictable  EVALUATION COMPLEXITY: High  PLAN:  PT FREQUENCY: 1-2x/week  PT DURATION: 12 weeks  PLANNED INTERVENTIONS: 97164- PT Re-evaluation, 97750- Physical Performance Testing, 97110-Therapeutic exercises, 97530- Therapeutic activity, 97112- Neuromuscular re-education, 97535- Self Care, [id number]- Manual therapy, [phone]- Gait training, [phone]- Aquatic Therapy, [phone]- Electrical stimulation (unattended), [phone]- Electrical stimulation (manual), Patient/Family education, Balance training, Stair training, Taping, Vestibular training, Visual/preceptual remediation/compensation, DME instructions, Wheelchair mobility training, Cryotherapy, and Moist heat  PLAN FOR NEXT SESSION:   BERG HEP review  Massie FORBES Dollar, PT 02/29/2024, 9:38 AM

## 2024-03-02 ENCOUNTER — Ambulatory Visit: Admitting: Physical Therapy

## 2024-03-06 ENCOUNTER — Other Ambulatory Visit: Payer: Self-pay | Admitting: Internal Medicine

## 2024-03-06 DIAGNOSIS — Z1231 Encounter for screening mammogram for malignant neoplasm of breast: Secondary | ICD-10-CM

## 2024-03-07 ENCOUNTER — Ambulatory Visit: Admitting: Physical Therapy

## 2024-03-07 ENCOUNTER — Ambulatory Visit

## 2024-03-07 NOTE — Therapy (Deleted)
 OUTPATIENT PHYSICAL THERAPY NEURO Treatment    Patient Name: Lynn Alvarez MRN: [id number] DOB:Jul 23, 1970, 53 y.o., female Today's Date: 03/07/2024   PCP: Amil Sovereign, MD  REFERRING PROVIDER: Devera Bruckner, MD   END OF SESSION:    Past Medical History:  Diagnosis Date   Hypertension    Multiple sclerosis    No past surgical history on file. Patient Active Problem List   Diagnosis Date Noted   Malnutrition of moderate degree 10/11/2020   Bradycardia 10/10/2020   Visual impairment 10/09/2020   Hypertension    Multiple sclerosis     ONSET DATE: March 2023  REFERRING DIAG:  G35.D (ICD-10-CM) - MS (multiple sclerosis)  Z74.09 (ICD-10-CM) - Impaired functional mobility, balance, gait, and endurance  R53.1 (ICD-10-CM) - Generalized weakness    THERAPY DIAG:  No diagnosis found.  Rationale for Evaluation and Treatment: Rehabilitation  SUBJECTIVE:                                                                                                                                                                                             SUBJECTIVE STATEMENT:  Pt arrived late to scheduled PT Treatment, but able to be seen by author. States that she feels tight today, but glad to have transitioned PT treatment to [facility]    From Eval:  Per pt,  Diagnosed with relapsing remitting  MS in 2005. Was able to keep working until 2023. Reports that she had some muscle stiffness that caused her to seek medical care.  In 2023 pt reports CVA. Report that her mobility has gotten much worse. States constant pain BLE but worse in the RLE. Reports that BLE feel weak and heavy, but the R is worse than the L. Pt transitioning care to [facility] for improved ease of access to PT services.   Pt accompanied by: self  PERTINENT HISTORY:   From recent MD visit- 53 y.o. female presenting to clinic for follow-up regarding MS, with plans to initiate Briumvi for DMT. Since  the last visit with me on 11/30/23, she has been experiencing significant fatigue and pain over the past few weeks. She unfortunately has not been able to initiate Briumvi yet, nor has she started the Cymbalta recommended at our last visit.    PAIN:  Are you having pain? Yes: NPRS scale: 0 Pain location: BLE  Pain description: soreness and occasional stabbing  Aggravating factors: sitting too long  Relieving factors: rest.  PRECAUTIONS: Fall  RED FLAGS: None   WEIGHT BEARING RESTRICTIONS: No  FALLS: Has patient fallen in last 6 months? No and a few near falls   LIVING ENVIRONMENT: Lives  with: lives with their spouse Lives in: House/apartment Stairs: No Has following equipment at home: Single point cane and Environmental Consultant - 4 wheeled  PLOF: Independent with household mobility with device, Requires assistive device for independence, Needs assistance with ADLs, and uses motorized cart for access to stores   PATIENT GOALS:  Improve strength in legs Get back to walk   OBJECTIVE:  Note: Objective measures were completed at Evaluation unless otherwise noted.  DIAGNOSTIC FINDINGS:  CT 2023 thoracic IMPRESSION: 1. No acute displaced fracture or traumatic listhesis of the cervical spine. 2. No acute displaced fracture or traumatic listhesis of the thoracic spine. Cervical  IMPRESSION: 1. No acute displaced fracture or traumatic listhesis of the cervical spine. 2. No acute displaced fracture or traumatic listhesis of the thoracic spine  COGNITION: Overall cognitive status: Within functional limits for tasks assessed   SENSATION: WFL  COORDINATION: Severely ridged/tonic in BLE   EDEMA:    MUSCLE TONE: RLE: Moderate, Severe, Rigidity, and Hypertonic  MUSCLE LENGTH: Need to assess.     POSTURE: rounded shoulders, forward head, decreased lumbar lordosis, increased thoracic kyphosis, left pelvic obliquity, and flexed trunk   LOWER EXTREMITY ROM:     Active  Right Eval  Left Eval  Hip flexion Lacking 15 deg Lacking 10 deg   Hip extension    Hip abduction 0-5deg beyond neutral  5-10 deg beyond neutral  Hip adduction    Hip internal rotation    Hip external rotation    Knee flexion    Knee extension    Ankle dorsiflexion 5 DF 10 DF  Ankle plantarflexion    Ankle inversion    Ankle eversion     *was able to increase ROM with prolonged passive stretching    (Blank rows = not tested)  LOWER EXTREMITY MMT:    MMT Right Eval Left Eval  Hip flexion 4 4  Hip extension    Hip abduction 4 4  Hip adduction 4+ 4+  Hip internal rotation    Hip external rotation    Knee flexion 4+ 4+  Knee extension 4- 4  Ankle dorsiflexion 4 4+  Ankle plantarflexion    Ankle inversion    Ankle eversion    Strength assessed in available range.   (Blank rows = not tested)  BED MOBILITY:  Not tested  TRANSFERS: Sit to stand: SBA and CGA  Assistive device utilized: Environmental Consultant - 4 wheeled and arm rest     Stand to sit: SBA and CGA  Assistive device utilized: Environmental Consultant - 4 wheeled and arm rest     Chair to chair: SBA and CGA  Assistive device utilized: Environmental Consultant - 4 wheeled       RAMP:  Not tested  CURB:  Not tested  STAIRS: Not tested GAIT: Findings: Gait Characteristics: step to pattern, decreased stride length, circumduction- Right, Right hip hike, Right steppage, scissoring, narrow BOS, poor foot clearance- Right, and poor foot clearance- Left, Distance walked: 20, Assistive device utilized:Walker - 4 wheeled, Level of assistance: CGA and Min A, and Comments:    FUNCTIONAL TESTS:  5 times sit to stand: 47 sec Timed up and go (TUG): 1:53 min  PATIENT SURVEYS:  ABC scale: The Activities-Specific Balance Confidence (ABC) Scale 0% 10 20 30  40 50 60 70 80 90 100% No confidence<->completely confident  "How confident are you that you will not lose your balance or become unsteady when you . . .   Date tested   Walk around the house  2. Walk up or down stairs    3. Bend over and pick up a slipper from in front of a closet floor   4. Reach for a small can off a shelf at eye level   5. Stand on tip toes and reach for something above your head   6. Stand on a chair and reach for something   7. Sweep the floor   8. Walk outside the house to a car parked in the driveway   9. Get into or out of a car   10. Walk across a parking lot to the mall   11. Walk up or down a ramp   12. Walk in a crowded mall where people rapidly walk past you   13. Are bumped into by people as you walk through the mall   14. Step onto or off of an escalator while you are holding onto the railing   15. Step onto or off an escalator while holding onto parcels such that you cannot hold onto the railing   16. Walk outside on icy sidewalks   Total: #/16  62%                                                                                                                                 TREATMENT DATE: 02/24/24  HS stretch x 2 min bil  Hip adductor stretch in seated clam shell position x 1.5 min  Sit<>stand throughout session with moderate cues improved use of RLE to push into standing and overcome flexor tone in RLE.   10 Meter Walk Test: Patient instructed to walk 10 meters (32.8 ft) as quickly and as safely as possible at their normal speed x2 and at a fast speed x2. Time measured from 2 meter mark to 8 meter mark to accommodate ramp-up and ramp-down.  Normal speed 1: 47.3 sec with 4WW Normal speed 2: 39.7sec with 4WW Average Normal speed: 43.5sec  0.68m/s   Cut off scores: <0.4 m/s = household Ambulator, 0.4-0.8 m/s = limited community Ambulator, >0.8 m/s = community Ambulator, >1.2 m/s = crossing a street, <1.0 = increased fall risk MCID 0.05 m/s (small), 0.13 m/s (moderate), 0.06 m/s (significant)  (ANPTA Core Set of Outcome Measures for Adults with Neurologic Conditions, 2018)  Gait training through rehab gym with 4WW 2x 71ft to and from restroom for toilet needs. Was  able to transfer without assist and perform hygiene once in restroom. Standing in slightly flexed posture. Narrow BOS for gait with decreased step length/height on BLE R>L.   PT instructed pt in HEP as listed below   PATIENT EDUCATION: Education details: POC. Goals.  Person educated: Patient Education method: Explanation Education comprehension: verbalized understanding  HOME EXERCISE PROGRAM: Access Code: [email or web address] URL: [email or web address] Date: 02/29/2024 Prepared by: Massie Dollar  Exercises - Sit to Stand with Armchair  - 1 x daily - 7 x weekly - 3 sets - 6 reps -  Seated Long Arc Quad  - 1 x daily - 7 x weekly - 3 sets - 10 reps - Seated March  - 1 x daily - 7 x weekly - 3 sets - 10 reps - Seated Hamstring Stretch  - 1 x daily - 7 x weekly - 2 sets - 4 reps - 20 sec  hold - Supine 90/90 Hamstring Stretch with Caregiver  - 1 x daily - 7 x weekly - 1 sets - 3 reps - 30 sec  hold - Bent Knee Fallouts  - 1 x daily - 7 x weekly - 3 sets - 10 reps - Hip Abduction and Adduction Caregiver PROM  - 1 x daily - 7 x weekly - 1 sets - 3 reps - 30 sec  hold  GOALS: Goals reviewed with patient? Yes   SHORT TERM GOALS: Target date: 03/24/2024    Patient will be independent in home exercise program to improve strength/mobility for better functional independence with ADLs. Baseline: to be initiated  Goal status: INITIAL   LONG TERM GOALS: Target date: 05/25/2024    Patient will increase ABC scale score >80% to demonstrate better functional mobility and better confidence with ADLs.  Baseline:62% Goal status: INITIAL  2.  Patient (> 44 years old) will improve 5x STS by > 10 seconds indicating an increased LE strength and improved balance. Baseline: 47sec  Goal status: INITIAL  3.  Patient will increase Berg Balance score by > 6 points to demonstrate decreased fall risk during functional activities Baseline: to be completed  Goal status: INITIAL  4.  Patient will  increase 10 meter walk test by 0.89m/s as to improve gait speed for better community ambulation and to reduce fall risk. Baseline: to be completed  Goal status: INITIAL  5.  Patient will reduce timed Up and go by 15 seconds  seconds to reduce fall risk and demonstrate improved transfer/gait ability. Baseline: 1:85min Goal status: INITIAL    ASSESSMENT:  CLINICAL IMPRESSION:  ***  OBJECTIVE IMPAIRMENTS: Abnormal gait, cardiopulmonary status limiting activity, decreased activity tolerance, decreased balance, decreased coordination, decreased endurance, decreased knowledge of condition, decreased knowledge of use of DME, decreased mobility, difficulty walking, decreased ROM, decreased strength, decreased safety awareness, hypomobility, increased fascial restrictions, impaired perceived functional ability, increased muscle spasms, impaired flexibility, impaired sensation, impaired tone, impaired UE functional use, improper body mechanics, and postural dysfunction.   ACTIVITY LIMITATIONS: carrying, lifting, bending, standing, squatting, stairs, transfers, bed mobility, self feeding, hygiene/grooming, and locomotion level  PARTICIPATION LIMITATIONS: meal prep, cleaning, laundry, driving, shopping, and community activity  PERSONAL FACTORS: Age, Behavior pattern, Past/current experiences, Profession, Social background, Time since onset of injury/illness/exacerbation, and 3+ comorbidities:   are also affecting patient's functional outcome.   REHAB POTENTIAL: Good  CLINICAL DECISION MAKING: Unstable/unpredictable  EVALUATION COMPLEXITY: High  PLAN:  PT FREQUENCY: 1-2x/week  PT DURATION: 12 weeks  PLANNED INTERVENTIONS: 97164- PT Re-evaluation, 97750- Physical Performance Testing, 97110-Therapeutic exercises, 97530- Therapeutic activity, 97112- Neuromuscular re-education, 97535- Self Care, 02859- Manual therapy, [phone]- Gait training, [phone]- Aquatic Therapy, [phone]- Electrical stimulation  (unattended), [phone]- Electrical stimulation (manual), Patient/Family education, Balance training, Stair training, Taping, Vestibular training, Visual/preceptual remediation/compensation, DME instructions, Wheelchair mobility training, Cryotherapy, and Moist heat  PLAN FOR NEXT SESSION:   BERG HEP review  Fonda Simpers, PT, DPT Physical Therapist - [place]  [facility]  03/07/24, 7:39 AM

## 2024-03-08 NOTE — Progress Notes (Signed)
 Received fax from [facility] for Plan of Care. Placed in Dr. Liliane folder.

## 2024-03-09 ENCOUNTER — Telehealth: Payer: Self-pay

## 2024-03-09 ENCOUNTER — Ambulatory Visit

## 2024-03-09 NOTE — Telephone Encounter (Signed)
 Pt did not arrive for scheduled appointment. No telephone call nor message preceeded this absence. Author attempted to contact pt via telephone number listed in chart. Pt was informed of missed visit today and 3 prior no show appointments on 10/21, 10/23, 10/28. Pt reports unaware that she missed any other appointments. Pt reminded of clinic attendance policy and is removed from remaining appointments, will need a new referral from her MD to return to PT.   9:21 AM, 03/09/24 Peggye JAYSON Linear, PT, DPT Physical Therapist - [facility]  Outpatient Physical Therapy- Main Campus [phone]

## 2024-03-13 ENCOUNTER — Ambulatory Visit

## 2024-03-14 ENCOUNTER — Ambulatory Visit: Admitting: Physical Therapy

## 2024-03-16 ENCOUNTER — Ambulatory Visit: Admitting: Physical Therapy

## 2024-03-16 ENCOUNTER — Ambulatory Visit

## 2024-03-21 ENCOUNTER — Ambulatory Visit

## 2024-03-21 ENCOUNTER — Ambulatory Visit: Admitting: Physical Therapy

## 2024-03-23 ENCOUNTER — Ambulatory Visit

## 2024-03-24 ENCOUNTER — Ambulatory Visit: Admitting: Physical Therapy

## 2024-03-28 ENCOUNTER — Ambulatory Visit: Admitting: Physical Therapy

## 2024-03-28 ENCOUNTER — Ambulatory Visit

## 2024-03-30 ENCOUNTER — Ambulatory Visit: Admitting: Physical Therapy

## 2024-04-04 ENCOUNTER — Ambulatory Visit

## 2024-04-04 ENCOUNTER — Ambulatory Visit: Admitting: Physical Therapy

## 2024-04-07 ENCOUNTER — Other Ambulatory Visit: Payer: Self-pay

## 2024-04-07 ENCOUNTER — Emergency Department
Admission: EM | Admit: 2024-04-07 | Discharge: 2024-04-07 | Disposition: A | Discharge status: Home / Self Care | Attending: Emergency Medicine | Admitting: Emergency Medicine

## 2024-04-07 DIAGNOSIS — M79604 Pain in right leg: Secondary | ICD-10-CM | POA: Insufficient documentation

## 2024-04-07 DIAGNOSIS — N3000 Acute cystitis without hematuria: Secondary | ICD-10-CM | POA: Diagnosis not present

## 2024-04-07 LAB — CBC
HCT: 35.9 % — ABNORMAL LOW (ref 36.0–46.0)
Hemoglobin: 11.5 g/dL — ABNORMAL LOW (ref 12.0–15.0)
MCH: 28.3 pg (ref 26.0–34.0)
MCHC: 32 g/dL (ref 30.0–36.0)
MCV: 88.2 fL (ref 80.0–100.0)
Platelets: 215 K/uL (ref 150–400)
RBC: 4.07 MIL/uL (ref 3.87–5.11)
RDW: 11.6 % (ref 11.5–15.5)
WBC: 4.4 K/uL (ref 4.0–10.5)
nRBC: 0 % (ref 0.0–0.2)

## 2024-04-07 LAB — URINALYSIS, ROUTINE W REFLEX MICROSCOPIC
Bilirubin Urine: NEGATIVE
Glucose, UA: NEGATIVE mg/dL
Ketones, ur: 5 mg/dL — AB
Nitrite: POSITIVE — AB
Protein, ur: 30 mg/dL — AB
Specific Gravity, Urine: 1.028 (ref 1.005–1.030)
pH: 5 (ref 5.0–8.0)

## 2024-04-07 LAB — BASIC METABOLIC PANEL WITH GFR
Anion gap: 11 (ref 5–15)
BUN: 11 mg/dL (ref 6–20)
CO2: 25 mmol/L (ref 22–32)
Calcium: 8.9 mg/dL (ref 8.9–10.3)
Chloride: 107 mmol/L (ref 98–111)
Creatinine, Ser: 1.08 mg/dL — ABNORMAL HIGH (ref 0.44–1.00)
GFR, Estimated: >60 mL/min (ref >60)
Glucose, Bld: 92 mg/dL (ref 70–99)
Potassium: 3.6 mmol/L (ref 3.5–5.1)
Sodium: 142 mmol/L (ref 135–145)

## 2024-04-07 LAB — CK: Total CK: 204 U/L (ref 38–234)

## 2024-04-07 MED ORDER — TRAMADOL HCL 50 MG PO TABS: 50.0000 mg | ORAL_TABLET | Freq: Four times a day (QID) | ORAL | 0 refills | Status: AC | PRN

## 2024-04-07 MED ORDER — CEPHALEXIN 500 MG PO CAPS: 500.0000 mg | ORAL_CAPSULE | Freq: Three times a day (TID) | ORAL | 0 refills | Status: AC

## 2024-04-07 MED ORDER — METHOCARBAMOL 500 MG PO TABS: 500.0000 mg | ORAL_TABLET | Freq: Three times a day (TID) | ORAL | 1 refills | Status: AC | PRN

## 2024-04-07 NOTE — ED Provider Notes (Signed)
 [facility] Provider Note    Event Date/Time   First MD Initiated Contact with Patient 04/07/24 1348     (approximate)   History   Leg Pain   HPI  Shelle Galdamez is a 53 y.o. female with a history of multiple sclerosis presents with complaints of cramping pain in her right leg.  She reports over 2 weeks ago she developed cramping in her right leg which led to her right leg drawing up .  She is unable to extend it fully.  She states this is not consistent with MS flare for her.  No falls reported     Physical Exam   Triage Vital Signs: ED Triage Vitals  Encounter Vitals Group     BP 04/07/24 1327 (!) 167/111     Girls Systolic BP Percentile --      Girls Diastolic BP Percentile --      Boys Systolic BP Percentile --      Boys Diastolic BP Percentile --      Pulse Rate 04/07/24 1327 64     Resp 04/07/24 1327 20     Temp 04/07/24 1327 98.9 F (37.2 C)     Temp Source 04/07/24 1327 Oral     SpO2 04/07/24 1327 100 %     Weight 04/07/24 1325 68 kg (150 lb)     Height 04/07/24 1325 1.626 m (5' 4)     Head Circumference --      Peak Flow --      Pain Score 04/07/24 1323 8     Pain Loc --      Pain Education --      Exclude from Growth Chart --     Most recent vital signs: Vitals:   04/07/24 1327 04/07/24 1359  BP: (!) 167/111   Pulse: 64   Resp: 20   Temp: 98.9 F (37.2 C)   SpO2: 100% 100%     General: Awake, no distress.  CV:  Good peripheral perfusion.  Resp:  Normal effort.  Abd:  No distention.  Other:  Right leg is held in flexion at the hip, she has pain when trying to extend   ED Results / Procedures / Treatments   Labs (all labs ordered are listed, but only abnormal results are displayed) Labs Reviewed  URINALYSIS, ROUTINE W REFLEX MICROSCOPIC - Abnormal; Notable for the following components:      Result Value   Color, Urine YELLOW (*)    APPearance CLOUDY (*)    Hgb urine dipstick SMALL (*)    Ketones, ur 5 (*)     Protein, ur 30 (*)    Nitrite POSITIVE (*)    Leukocytes,Ua TRACE (*)    Bacteria, UA MANY (*)    All other components within normal limits  BASIC METABOLIC PANEL WITH GFR - Abnormal; Notable for the following components:   Creatinine, Ser 1.08 (*)    All other components within normal limits  CBC - Abnormal; Notable for the following components:   Hemoglobin 11.5 (*)    HCT 35.9 (*)    All other components within normal limits  CK     EKG     RADIOLOGY     PROCEDURES:  Critical Care performed:   Procedures   MEDICATIONS ORDERED IN ED: Medications - No data to display   IMPRESSION / MDM / ASSESSMENT AND PLAN / ED COURSE  I reviewed the triage vital signs and the nursing notes. Patient's presentation  is most consistent with exacerbation of chronic illness.  Patient presents with cramping pain as above, unclear cause.  Her electrolytes are overall reassuring.  She has had a stroke back in 2023 but reports no deficits from that.  No back pain.  No falls.  No trauma.  Discussed at length with patient.  Feel that physical therapy would be most useful for this, she had been getting physical therapy but missed 2 sessions so was dropped.  I offered admission to the hospital but she declined  Nurse noted that urine is foul-smelling, urinalysis is consistent with likely UTI will treat with antibiotic        FINAL CLINICAL IMPRESSION(S) / ED DIAGNOSES   Final diagnoses:  Right leg pain  Acute cystitis without hematuria     Rx / DC Orders   ED Discharge Orders          Ordered    cephALEXin (KEFLEX) 500 MG capsule  3 times daily        04/07/24 1430    methocarbamol (ROBAXIN) 500 MG tablet  Every 8 hours PRN        04/07/24 1430    traMADol (ULTRAM) 50 MG tablet  Every 6 hours PRN        04/07/24 1430             Note:  This document was prepared using Dragon voice recognition software and may include unintentional dictation errors.   Arlander Charleston, MD 04/07/24 1440

## 2024-04-07 NOTE — ED Triage Notes (Signed)
 Pt to ED POV for bilateral leg pain since 1 week to whole leg. States usually uses rollator walker but her R leg has been drawn up and has not been able to walk because her foot doesn't reach the floor. Pt is hypertensive int riage, states did not take BP meds today. Pt also has strong urine smell. Denies dysuria. Denies injury but states she thinks she was dehydrated last week. Pain is sharp, aching and like spasms. Hx MS.

## 2024-04-10 ENCOUNTER — Ambulatory Visit

## 2024-04-11 ENCOUNTER — Ambulatory Visit: Admitting: Physical Therapy

## 2024-04-12 ENCOUNTER — Ambulatory Visit

## 2024-04-12 ENCOUNTER — Ambulatory Visit: Attending: Neurology | Admitting: Physical Therapy

## 2024-04-12 DIAGNOSIS — R262 Difficulty in walking, not elsewhere classified: Secondary | ICD-10-CM | POA: Diagnosis present

## 2024-04-12 DIAGNOSIS — R269 Unspecified abnormalities of gait and mobility: Secondary | ICD-10-CM | POA: Diagnosis present

## 2024-04-12 DIAGNOSIS — M6289 Other specified disorders of muscle: Secondary | ICD-10-CM | POA: Diagnosis present

## 2024-04-12 NOTE — Therapy (Signed)
 OUTPATIENT PHYSICAL THERAPY NEURO EVALUATION   Patient Name: Lynn Alvarez MRN: [id number] DOB:11-15-1970, 53 y.o., female Today's Date: 04/13/2024   PCP: Amil Sovereign, MD  REFERRING PROVIDER: Murry Rosina DASEN, PA-C    END OF SESSION:  PT End of Session - 04/12/24 1532     Visit Number 1    Number of Visits 25    Date for Recertification  05/18/24    PT Start Time 1447    PT Stop Time 1526    PT Time Calculation (min) 39 min    Equipment Utilized During Treatment Gait belt    Activity Tolerance Patient tolerated treatment well    Behavior During Therapy WFL for tasks assessed/performed           Past Medical History:  Diagnosis Date   Hypertension    Multiple sclerosis    Stroke (HCC) 2023   no deficits   No past surgical history on file. Patient Active Problem List   Diagnosis Date Noted   Malnutrition of moderate degree 10/11/2020   Bradycardia 10/10/2020   Visual impairment 10/09/2020   Hypertension    Multiple sclerosis     ONSET DATE: March 2023  REFERRING DIAG:  G35.D (ICD-10-CM) - MS (multiple sclerosis)  Z74.09 (ICD-10-CM) - Impaired functional mobility, balance, gait, and endurance  R53.1 (ICD-10-CM) - Generalized weakness    THERAPY DIAG:  Abnormality of gait and mobility - Plan: PT plan of care cert/re-cert  Difficulty in walking, not elsewhere classified - Plan: PT plan of care cert/re-cert  Abnormal increased muscle tone - Plan: PT plan of care cert/re-cert  Rationale for Evaluation and Treatment: Rehabilitation  SUBJECTIVE:                                                                                                                                                                                             SUBJECTIVE STATEMENT:  Pt reports after previous discharge due to no shows within the next couple of days her R leg tightened up very bad in to knee and hip flexion. Pt working on getting wheelchair but is not aware of  where she is at in the process. Pt reports she has been getting moved around by her husband via transfers as well as being pushed in her Rollator for her mobility. Has not walked in several weeks. Pt reports 3-4/10 pain in the R LE in knee mostly but sometimes in the hip region. Pt was pushing rollator prior to her R knee getting really tight and this causing mobility limitations. Pt reports she thought about home health but because she is able to get out of  her home she does not think she will qualify.   Pt accompanied by: self  PERTINENT HISTORY:   From recent MD visit- 53 y.o. female presenting to clinic for follow-up regarding MS, with plans to initiate Briumvi for DMT. Since the last visit with me on 11/30/23, she has been experiencing significant fatigue and pain over the past few weeks. She unfortunately has not been able to initiate Briumvi yet, nor has she started the Cymbalta recommended at our last visit.   PAIN:  Are you having pain? Yes: NPRS scale: 0 Pain location: BLE  Pain description: soreness and occasional stabbing  Aggravating factors: sitting too long  Relieving factors: rest.  PRECAUTIONS: Fall  RED FLAGS: None   WEIGHT BEARING RESTRICTIONS: No  FALLS: Has patient fallen in last 6 months? No and a few near falls   LIVING ENVIRONMENT: Lives with: lives with their spouse Lives in: House/apartment Stairs: No Has following equipment at home: Single point cane and Environmental Consultant - 4 wheeled  PLOF: Independent with household mobility with device, Requires assistive device for independence, Needs assistance with ADLs, and uses motorized cart for access to stores   PATIENT GOALS:  Improve strength in legs Get back to walk   OBJECTIVE:  Note: Objective measures were completed at Evaluation unless otherwise noted.  DIAGNOSTIC FINDINGS:  CT 2023 thoracic IMPRESSION: 1. No acute displaced fracture or traumatic listhesis of the cervical spine. 2. No acute displaced  fracture or traumatic listhesis of the thoracic spine. Cervical  IMPRESSION: 1. No acute displaced fracture or traumatic listhesis of the cervical spine. 2. No acute displaced fracture or traumatic listhesis of the thoracic spine  COGNITION: Overall cognitive status: Within functional limits for tasks assessed   SENSATION: WFL  COORDINATION: Severely ridged/tonic in BLE   EDEMA:    MUSCLE TONE: RLE: Moderate, Severe, Rigidity, and Hypertonic  MUSCLE LENGTH: Need to assess.     POSTURE: rounded shoulders, forward head, decreased lumbar lordosis, increased thoracic kyphosis, left pelvic obliquity, and flexed trunk   LOWER EXTREMITY ROM:     Active  Right Eval Left Eval  Hip flexion    Hip extension    Hip abduction    Hip adduction    Hip internal rotation    Hip external rotation    Knee flexion    Knee extension  Lacking 73 degrees from fullextension after significant stretching. Resting around 100 degrees knee flexion and cannot activate extensors to improve this condition .   Ankle dorsiflexion    Ankle plantarflexion    Ankle inversion    Ankle eversion     *was able to increase ROM with prolonged passive stretching    (Blank rows = not tested)  LOWER EXTREMITY MMT:     BED MOBILITY:  Not tested Findings: Sit to supine Mod A Supine to sit Mod A  TRANSFERS: Sit to stand: Min A  Assistive device utilized: Environmental Consultant - 2 wheeled, Environmental Consultant - 2 wheeled, and surface of mat table     Stand to sit: Walker - 2 wheeled, Walker - 2 wheeled, and surface of mat table     Chair to chair: Max A from PT   RAMP:  Not testedUnable to ambulate, knee in >90 degrees flexion and hip flexion synergy.   CURB:  Not testedUnable to ambulate, knee in >90 degrees flexion and hip flexion synergy.   STAIRS: Not testedUnable to ambulate, knee in >90 degrees flexion and hip flexion synergy.  GAIT: Unable to ambulate, knee in >  90 degrees flexion and hip flexion synergy.    FUNCTIONAL TESTS:   Pt max A transfer form transport chair to mat table.  5 times sit to stand:  sec (last eval) Timed up and go (TUG): 1:53 min ( last eval)  PATIENT SURVEYS:   LEFS next visit                                                                                                                             TREATMENT DATE: 04/13/24  SELF CARE  Patient instructed in plan of care, findings for evaluation, and ways of physical therapy may improve their function and quality of life.  TE- To improve strength, endurance, mobility, and function of specific targeted muscle groups or improve joint range of motion or improve muscle flexibility  Knee extension/ HS stretch x 60 sec seated and x 3 min supine. Able to get to 73 degrees knee flexion following heavy stretching. Pt knee resting at approx 100 deg knee flexion seemingly constantly. Instructed her to make effort to support it to progressively stretch the musculature even if just in seated and propped on pillow. .   -noted flexor synergistic pattern with stretching, pt would unintentionally contract hip flexor, knee flexor and ankle DFs  PATIENT EDUCATION: Education details: POC. Goals.  Person educated: Patient Education method: Explanation Education comprehension: verbalized understanding  HOME EXERCISE PROGRAM: To be initiated on following session   GOALS: Goals reviewed with patient? Yes   SHORT TERM GOALS: Target date: 05/10/2024   Patient will be independent in home exercise program to improve strength/mobility for better functional independence with ADLs. Baseline: to be initiated  Goal status: INITIAL   LONG TERM GOALS: Target date: 05/18/24    Patient will increase LEFS scale score by 10 points to demonstrate better functional mobility and better confidence with ADLs.  Baseline: Goal status: INITIAL  2.  Patient (> 67 years old) will improve 5x STS by > 10 seconds indicating an increased LE strength  and improved balance. Baseline: 25.68 sec with heavy UE use, R LE not on ground and walker support Goal status: INITIAL  3.  Pt will perform independent stand pivot transfer with LRAD in order to allow for improved mobility in home and community Baseline: dependent for transfer  Goal status: INITIAL  4.  Pt will demonstrate improved knee extension ROM to 30 degrees from extension or greater to indicate improved capacity for WB and ambulation.  Baseline:   Goal status: INITIAL  5.  Pt will ambulate 15 ft with LRAD to demonstrate improved capacity for mobility within her home and improved access to her community.  Baseline:  Goal status: INITIAL    ASSESSMENT:  CLINICAL IMPRESSION:  Patient is a 53 y.o. female who presents to PT with mobility impairments. Pt has significantly deteriorated since last visit in clinic. She is having a new symptoms of flexor synergy in her R LE that is so limiting pt is unable  to extend R LE for WB or walking activities. With significant stretching and pt able to get to 77 degrees from full knee extension, which is clearly not a range that is functional for mobility. Pt has been dependent on husband for mobility for nearly 4 weeks. Pt recommended to see neurologist to assess this tightness as this is likely outside of what PT will be able to assist with and she will need other management in conjunction with PT. Pt called office but unable to establish contact with physician or nurse and voicemail was left describing the situation and recommending follow up. Pt informed of all recommendations above and was encouraged to contact referring physician office to address her tone/ synergistic patterns. Pt will benefit from skilled PT to address PT goals, address impairments and improve her QOL.   OBJECTIVE IMPAIRMENTS: Abnormal gait, cardiopulmonary status limiting activity, decreased activity tolerance, decreased balance, decreased coordination, decreased endurance,  decreased knowledge of condition, decreased knowledge of use of DME, decreased mobility, difficulty walking, decreased ROM, decreased strength, decreased safety awareness, hypomobility, increased fascial restrictions, impaired perceived functional ability, increased muscle spasms, impaired flexibility, impaired sensation, impaired tone, impaired UE functional use, improper body mechanics, and postural dysfunction.   ACTIVITY LIMITATIONS: carrying, lifting, bending, standing, squatting, stairs, transfers, bed mobility, self feeding, hygiene/grooming, and locomotion level  PARTICIPATION LIMITATIONS: meal prep, cleaning, laundry, driving, shopping, and community activity  PERSONAL FACTORS: Age, Behavior pattern, Past/current experiences, Profession, Social background, Time since onset of injury/illness/exacerbation, and 3+ comorbidities:   are also affecting patient's functional outcome.   REHAB POTENTIAL: Good  CLINICAL DECISION MAKING: Unstable/unpredictable  EVALUATION COMPLEXITY: High  PLAN:  PT FREQUENCY: 1-2x/week  PT DURATION: 4 weeks  PLANNED INTERVENTIONS: 97164- PT Re-evaluation, 97750- Physical Performance Testing, 97110-Therapeutic exercises, 97530- Therapeutic activity, [id number]- Neuromuscular re-education, 97535- Self Care, 02859- Manual therapy, [id number]- Gait training, [phone]- Aquatic Therapy, [phone]- Electrical stimulation (unattended), [phone]- Electrical stimulation (manual), Patient/Family education, Balance training, Stair training, Taping, Vestibular training, Visual/preceptual remediation/compensation, DME instructions, Wheelchair mobility training, Cryotherapy, and Moist heat  PLAN FOR NEXT SESSION:   HS stretch, hip extensor stretch, light seated strengthening or supine strengthening if tolerated. Follow up on physician visit for R LE tone   Lonni KATHEE Gainer, PT 04/13/2024, 7:01 AM

## 2024-04-13 ENCOUNTER — Ambulatory Visit: Admitting: Physical Therapy

## 2024-04-17 ENCOUNTER — Ambulatory Visit

## 2024-04-18 ENCOUNTER — Ambulatory Visit: Admitting: Physical Therapy

## 2024-04-19 ENCOUNTER — Ambulatory Visit

## 2024-04-20 ENCOUNTER — Ambulatory Visit: Admitting: Physical Therapy

## 2024-04-20 ENCOUNTER — Ambulatory Visit

## 2024-04-24 ENCOUNTER — Ambulatory Visit

## 2024-04-24 ENCOUNTER — Ambulatory Visit: Admitting: Physical Therapy

## 2024-04-26 ENCOUNTER — Ambulatory Visit

## 2024-04-26 ENCOUNTER — Ambulatory Visit: Admitting: Physical Therapy

## 2024-05-01 ENCOUNTER — Ambulatory Visit

## 2024-05-02 ENCOUNTER — Ambulatory Visit: Admitting: Physical Therapy

## 2024-05-03 ENCOUNTER — Ambulatory Visit: Admitting: Physical Therapy

## 2024-05-08 ENCOUNTER — Ambulatory Visit

## 2024-05-10 ENCOUNTER — Ambulatory Visit: Admitting: Physical Therapy

## 2024-05-15 ENCOUNTER — Ambulatory Visit: Admitting: Physical Therapy

## 2024-05-15 ENCOUNTER — Ambulatory Visit

## 2024-05-17 ENCOUNTER — Ambulatory Visit

## 2024-05-17 ENCOUNTER — Ambulatory Visit: Attending: Physician Assistant

## 2024-05-17 DIAGNOSIS — R269 Unspecified abnormalities of gait and mobility: Secondary | ICD-10-CM | POA: Insufficient documentation

## 2024-05-17 DIAGNOSIS — R278 Other lack of coordination: Secondary | ICD-10-CM | POA: Insufficient documentation

## 2024-05-17 DIAGNOSIS — M6289 Other specified disorders of muscle: Secondary | ICD-10-CM | POA: Diagnosis present

## 2024-05-17 DIAGNOSIS — R262 Difficulty in walking, not elsewhere classified: Secondary | ICD-10-CM | POA: Insufficient documentation

## 2024-05-17 NOTE — Therapy (Signed)
 " OUTPATIENT PHYSICAL THERAPY NEURO TREATMENT/PT DISCHARGE SUMMARY   Patient Name: Lynn Alvarez MRN: [id number] DOB:12-Jun-1970, 54 y.o., female Today's Date: 05/17/2024   PCP: Amil Sovereign, MD  REFERRING PROVIDER: Murry Rosina DASEN, PA-C    END OF SESSION:  PT End of Session - 05/17/24 0909     Visit Number 2    Number of Visits 25    Date for Recertification  05/18/24    PT Start Time 0910    PT Stop Time 0951    PT Time Calculation (min) 41 min    Equipment Utilized During Treatment Gait belt    Activity Tolerance Patient tolerated treatment well    Behavior During Therapy [facility] for tasks assessed/performed           Past Medical History:  Diagnosis Date   Hypertension    Multiple sclerosis    Stroke (HCC) 2023   no deficits   History reviewed. No pertinent surgical history. Patient Active Problem List   Diagnosis Date Noted   Malnutrition of moderate degree 10/11/2020   Bradycardia 10/10/2020   Visual impairment 10/09/2020   Hypertension    Multiple sclerosis     ONSET DATE: March 2023  REFERRING DIAG:  G35.D (ICD-10-CM) - MS (multiple sclerosis)  Z74.09 (ICD-10-CM) - Impaired functional mobility, balance, gait, and endurance  R53.1 (ICD-10-CM) - Generalized weakness    THERAPY DIAG:  Abnormality of gait and mobility  Difficulty in walking, not elsewhere classified  Abnormal increased muscle tone  Other lack of coordination  Rationale for Evaluation and Treatment: Rehabilitation  SUBJECTIVE:                                                                                                                                                                                             SUBJECTIVE STATEMENT: Patient returns after 1 month absence stating she is not walking- can't walk and that her contracture is bad. States still has not received her wheelchair yet. States has botox scheduled for 05/26/2024. Husband reports he is dependently lifting  patient for all mobility. Patient denies ability to bear weight through RLE stating I can't straighten my leg at all.   FROM EVAL:  Pt reports after previous discharge due to no shows within the next couple of days her R leg tightened up very bad in to knee and hip flexion. Pt working on getting wheelchair but is not aware of where she is at in the process. Pt reports she has been getting moved around by her husband via transfers as well as being pushed in her Rollator for her mobility. Has not walked in several weeks.  Pt reports 3-4/10 pain in the R LE in knee mostly but sometimes in the hip region. Pt was pushing rollator prior to her R knee getting really tight and this causing mobility limitations. Pt reports she thought about home health but because she is able to get out of her home she does not think she will qualify.   Pt accompanied by: self  PERTINENT HISTORY:   From recent MD visit- 54 y.o. female presenting to clinic for follow-up regarding MS, with plans to initiate Briumvi for DMT. Since the last visit with me on 11/30/23, she has been experiencing significant fatigue and pain over the past few weeks. She unfortunately has not been able to initiate Briumvi yet, nor has she started the Cymbalta recommended at our last visit.   PAIN:  Are you having pain? Yes: NPRS scale: 0 Pain location: BLE  Pain description: soreness and occasional stabbing  Aggravating factors: sitting too long  Relieving factors: rest.  PRECAUTIONS: Fall  RED FLAGS: None   WEIGHT BEARING RESTRICTIONS: No  FALLS: Has patient fallen in last 6 months? No and a few near falls   LIVING ENVIRONMENT: Lives with: lives with their spouse Lives in: House/apartment Stairs: No Has following equipment at home: Single point cane and Environmental Consultant - 4 wheeled  PLOF: Independent with household mobility with device, Requires assistive device for independence, Needs assistance with ADLs, and uses motorized cart for access  to stores   PATIENT GOALS:  Improve strength in legs Get back to walk   OBJECTIVE:  Note: Objective measures were completed at Evaluation unless otherwise noted.  DIAGNOSTIC FINDINGS:  CT 2023 thoracic IMPRESSION: 1. No acute displaced fracture or traumatic listhesis of the cervical spine. 2. No acute displaced fracture or traumatic listhesis of the thoracic spine. Cervical  IMPRESSION: 1. No acute displaced fracture or traumatic listhesis of the cervical spine. 2. No acute displaced fracture or traumatic listhesis of the thoracic spine  COGNITION: Overall cognitive status: Within functional limits for tasks assessed   SENSATION: WFL  COORDINATION: Severely ridged/tonic in BLE   EDEMA:    MUSCLE TONE: RLE: Moderate, Severe, Rigidity, and Hypertonic  MUSCLE LENGTH: Need to assess.     POSTURE: rounded shoulders, forward head, decreased lumbar lordosis, increased thoracic kyphosis, left pelvic obliquity, and flexed trunk   LOWER EXTREMITY ROM:     Active  Right Eval Left Eval  Hip flexion    Hip extension    Hip abduction    Hip adduction    Hip internal rotation    Hip external rotation    Knee flexion    Knee extension  Lacking 73 degrees from fullextension after significant stretching. Resting around 100 degrees knee flexion and cannot activate extensors to improve this condition .   Ankle dorsiflexion    Ankle plantarflexion    Ankle inversion    Ankle eversion     *was able to increase ROM with prolonged passive stretching    (Blank rows = not tested)  LOWER EXTREMITY MMT:     BED MOBILITY:  Not tested Findings: Sit to supine Mod A Supine to sit Mod A  TRANSFERS: Sit to stand: Min A  Assistive device utilized: Environmental Consultant - 2 wheeled, Environmental Consultant - 2 wheeled, and surface of mat table     Stand to sit: Walker - 2 wheeled, Walker - 2 wheeled, and surface of mat table     Chair to chair: Max A from PT   RAMP:  Not testedUnable to  ambulate, knee  in >90 degrees flexion and hip flexion synergy.   CURB:  Not testedUnable to ambulate, knee in >90 degrees flexion and hip flexion synergy.   STAIRS: Not testedUnable to ambulate, knee in >90 degrees flexion and hip flexion synergy.  GAIT: Unable to ambulate, knee in >90 degrees flexion and hip flexion synergy.   FUNCTIONAL TESTS:   Pt max A transfer form transport chair to mat table.  5 times sit to stand:  sec (last eval) Timed up and go (TUG): 1:53 min ( last eval)  PATIENT SURVEYS:   LEFS next visit                                                                                                                             TREATMENT DATE: 05/17/2024  AROM measured: R knee ext= 120 deg from zero and painful -Continued notice of a RLE flexor synergistic pattern with stretching, pt would unintentionally contract hip flexor, knee flexor and ankle DFs   SELF CARE: Reviewed self stretching and stretching with assist from husband in supine or seated position.  Knee extension/ HS stretch x 60 sec seated and x 3 min supine. Able to get to 120 degrees knee flexion following heavy stretching. Instructed her to make effort to support it to progressively stretch the musculature even if just in seated and propped on pillow.   Long discussion of appropriateness of PT setting: HHPT vs. Outpatient. Discussed that due to the fact that she is dependent on her husband for all transfers/mobility and a very dependent and taxing effort on her and her husband physically - that Home health PT services would be more appropriate.   Discussed need for HHPT referral and also a manual w/c order as she is currently non-ambulatory and dependent on her husband to physically pick her up to transfer throughout the home and in/out of car - increasing risk of injury to self or caregiver.    PATIENT EDUCATION: Education details: POC. Goals.  Person educated: Patient Education method: Explanation Education  comprehension: verbalized understanding  HOME EXERCISE PROGRAM: To be initiated on following session   GOALS: Goals reviewed with patient? Yes   SHORT TERM GOALS: Target date: 05/10/2024   Patient will be independent in home exercise program to improve strength/mobility for better functional independence with ADLs. Baseline: to be initiated  Goal status: INITIAL   LONG TERM GOALS: Target date: 05/18/24    Patient will increase LEFS scale score by 10 points to demonstrate better functional mobility and better confidence with ADLs.  Baseline: Goal status: INITIAL  2.  Patient (> 29 years old) will improve 5x STS by > 10 seconds indicating an increased LE strength and improved balance. Baseline: 25.68 sec with heavy UE use, R LE not on ground and walker support Goal status: INITIAL  3.  Pt will perform independent stand pivot transfer with LRAD in order to allow for improved mobility in home and community Baseline: dependent  for transfer  Goal status: INITIAL  4.  Pt will demonstrate improved knee extension ROM to 30 degrees from extension or greater to indicate improved capacity for WB and ambulation.  Baseline:   Goal status: INITIAL  5.  Pt will ambulate 15 ft with LRAD to demonstrate improved capacity for mobility within her home and improved access to her community.  Baseline:  Goal status: INITIAL    ASSESSMENT:  CLINICAL IMPRESSION:  Patient is a 54 y.o. female who presents to PT with mobility impairments. Pt has continued to deteriorate physically since last visit in clinic. She is continuing to  have some flexor synery symptoms with RLE and unable to walk. Discussed at length the HHPT setting vs outpatient and feel that home health would be the more appropriate option at this time. Again reinforced recommendation from previous PT visit to see neurologist to assess this tightness as this is likely outside of what PT will be able to assist with and she will need other  management in conjunction with PT. Will discharge at this time with plan agreed by patient and husband to seek order for HHPT and order for Manual w/c  OBJECTIVE IMPAIRMENTS: Abnormal gait, cardiopulmonary status limiting activity, decreased activity tolerance, decreased balance, decreased coordination, decreased endurance, decreased knowledge of condition, decreased knowledge of use of DME, decreased mobility, difficulty walking, decreased ROM, decreased strength, decreased safety awareness, hypomobility, increased fascial restrictions, impaired perceived functional ability, increased muscle spasms, impaired flexibility, impaired sensation, impaired tone, impaired UE functional use, improper body mechanics, and postural dysfunction.   ACTIVITY LIMITATIONS: carrying, lifting, bending, standing, squatting, stairs, transfers, bed mobility, self feeding, hygiene/grooming, and locomotion level  PARTICIPATION LIMITATIONS: meal prep, cleaning, laundry, driving, shopping, and community activity  PERSONAL FACTORS: Age, Behavior pattern, Past/current experiences, Profession, Social background, Time since onset of injury/illness/exacerbation, and 3+ comorbidities:   are also affecting patient's functional outcome.   REHAB POTENTIAL: Good  CLINICAL DECISION MAKING: Unstable/unpredictable  EVALUATION COMPLEXITY: High  PLAN:  PT FREQUENCY: 1-2x/week  PT DURATION: 4 weeks  PLANNED INTERVENTIONS: 02835- PT Re-evaluation, 97750- Physical Performance Testing, 97110-Therapeutic exercises, 97530- Therapeutic activity, 97112- Neuromuscular re-education, 97535- Self Care, 02859- Manual therapy, [phone]- Gait training, [phone]- Aquatic Therapy, [phone]- Electrical stimulation (unattended), [phone]- Electrical stimulation (manual), Patient/Family education, Balance training, Stair training, Taping, Vestibular training, Visual/preceptual remediation/compensation, DME instructions, Wheelchair mobility training, Cryotherapy,  and Moist heat  PLAN FOR NEXT SESSION:   Discharge outpatient PT- Not appropriate setting- will need Follow up on physician visit for R LE tone and PT recommending HHPT order and order for manual WC to decrease risk of injury to patient and husband   Reyes LOISE London, PT 05/17/2024, 2:01 PM        "

## 2024-05-19 ENCOUNTER — Ambulatory Visit

## 2024-05-22 ENCOUNTER — Ambulatory Visit

## 2024-05-22 ENCOUNTER — Ambulatory Visit: Admitting: Physical Therapy

## 2024-05-24 ENCOUNTER — Ambulatory Visit: Admitting: Physical Therapy

## 2024-05-24 ENCOUNTER — Ambulatory Visit

## 2024-05-29 ENCOUNTER — Ambulatory Visit

## 2024-05-31 ENCOUNTER — Ambulatory Visit

## 2024-06-05 ENCOUNTER — Ambulatory Visit

## 2024-06-07 ENCOUNTER — Ambulatory Visit

## 2024-06-12 ENCOUNTER — Ambulatory Visit

## 2024-06-14 ENCOUNTER — Ambulatory Visit

## 2024-06-19 ENCOUNTER — Ambulatory Visit

## 2024-06-21 ENCOUNTER — Ambulatory Visit

## 2024-06-26 ENCOUNTER — Ambulatory Visit

## 2024-06-28 ENCOUNTER — Ambulatory Visit

## 2024-07-03 ENCOUNTER — Ambulatory Visit

## 2024-07-05 ENCOUNTER — Ambulatory Visit

## 2024-07-10 ENCOUNTER — Ambulatory Visit

## 2024-07-12 ENCOUNTER — Ambulatory Visit

## 2024-07-17 ENCOUNTER — Ambulatory Visit

## 2024-07-19 ENCOUNTER — Ambulatory Visit

## 2024-07-24 ENCOUNTER — Ambulatory Visit
